# Patient Record
Sex: Male | Born: 1970 | Race: White | Hispanic: No | Marital: Married | Smoking: Former smoker
Health system: Southern US, Community
[De-identification: ages and names within clinical notes are randomized; demographics above are authoritative.]

## PROBLEM LIST (undated history)

## (undated) DIAGNOSIS — F32A Depression, unspecified: Secondary | ICD-10-CM

## (undated) DIAGNOSIS — F329 Major depressive disorder, single episode, unspecified: Secondary | ICD-10-CM

## (undated) DIAGNOSIS — F419 Anxiety disorder, unspecified: Secondary | ICD-10-CM

## (undated) HISTORY — DX: Depression, unspecified: F32.A

## (undated) HISTORY — PX: VASECTOMY: SHX75

## (undated) HISTORY — DX: Major depressive disorder, single episode, unspecified: F32.9

## (undated) HISTORY — DX: Anxiety disorder, unspecified: F41.9

---

## 2005-03-15 ENCOUNTER — Inpatient Hospital Stay (HOSPITAL_COMMUNITY): Admission: EM | Admit: 2005-03-15 | Discharge: 2005-03-16 | Payer: Self-pay | Admitting: Emergency Medicine

## 2005-03-15 ENCOUNTER — Ambulatory Visit: Payer: Self-pay | Admitting: Family Medicine

## 2005-03-26 ENCOUNTER — Encounter: Admission: RE | Admit: 2005-03-26 | Discharge: 2005-03-26 | Payer: Self-pay | Admitting: Neurology

## 2010-08-13 ENCOUNTER — Encounter
Admission: RE | Admit: 2010-08-13 | Discharge: 2010-08-13 | Payer: Self-pay | Source: Home / Self Care | Attending: Family Medicine | Admitting: Family Medicine

## 2010-12-26 NOTE — H&P (Signed)
NAMEJOSEANDRES, MAZER             ACCOUNT NO.:  [id number]   MEDICAL RECORD NO.:  [id number]          PATIENT TYPE:  INP   LOCATION:  1831                         FACILITY:  [facility]   PHYSICIAN:  Wayne A. Sheffield Slider, M.D.    DATE OF BIRTH:  [birth year]   DATE OF ADMISSION:  03/15/2005  DATE OF DISCHARGE:                                HISTORY & PHYSICAL   PRIMARY CARE PHYSICIAN:  Brett Canales A. Daub, M.D.   CHIEF COMPLAINT:  Headache and meningism, subjective.   HISTORY OF PRESENT ILLNESS:  The patient is a 40 year old Caucasian male old Caucasian male  with no significant past medical history who presents with three days of  headaches, low grade fevers, myalgias, arthralgias, and meningism. The  patient states that he began with headaches on Friday night. The headaches  were frontal in location, throbbing in nature, also accompanied with  photophobia and phonophobia. The patient also had nausea with the headaches.  By Saturday, he was starting to have fevers and neck stiffness. The patient  states he is unable to touch his chin to either his left or right shoulder.   Beginning Saturday and heading into Sunday morning, the patient also was  having fevers, low grade in nature, and also some nausea and no vomiting,  along with the meningismus. The headaches persisted. The patient's headaches  were refractory to Midrin x4 today. The patient was seen at [facility] and referred to the emergency department for evaluation for possible  meningitis versus RMSF.   PAST MEDICAL HISTORY:  Depression.   MEDICATIONS:  1.  Lexapro 20 mg q.a.m.  2.  Multivitamin 1 p.o. daily.   ALLERGIES:  No known drug allergies.   PAST SURGICAL HISTORY:  Negative.   FAMILY HISTORY:  Mother is living with hypertension, otherwise alive and  well. Dad is living with hypertension, otherwise alive and well. He has a  couple of siblings that are also alive and well.   SOCIAL HISTORY:  The patient quit smoking approximately 18  years ago. Drinks  approximately three beers or glasses of wine per week. No recreational  drugs. Lives with wife and three children. 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   PHYSICAL EXAMINATION:  VITAL SIGNS:  Temperature at [facility] is  99.8, pulse is 77, respiratory 17, blood pressure is 122/72.  GENERAL:  In no apparent distress. Alert and oriented x3. Pleasant Caucasian  male.  HEENT:  Normocephalic and atraumatic. Pupils are equal, round, and reactive  to light. Extraocular movements intact. No thyromegaly, no JVD, no carotid  bruits auscultated.  CARDIOVASCULAR:  Regular rate and rhythm. No murmurs, gallops, or rubs.  LUNGS:  Clear to auscultation bilaterally. No wheezes, crackles, or rales.  ABDOMEN:  Soft, nontender, nondistended. Positive bowel sounds.  EXTREMITIES:  No clubbing, cyanosis, or edema. No petechiae, purpura  exhibited on the bilateral shins, hands, or palms. There is possibly an old  bug bite, it is approximately a 3 mm erythematous papule with a black  pinpoint center on the right shin.  NEUROLOGICAL:  Cranial nerves II through XII are grossly intact.  There is  5/5 equal and symmetric muscle strength bilaterally with normal reflexes.   LABORATORY DATA:  Laboratory values from [facility] include a white  count of 4.6, hemoglobin of 15.7, hematocrit of 44, platelet count of  299,000.   ASSESSMENT:  A 40 year old Caucasian male with no specific past medical  history with symptoms concerning for possible meningismus versus [facility] Spotted Fever.   PLAN:  1.  Headache:  The headaches seem almost like migraines but the patient has      never had a history of migraines this severe before. We will go ahead      and treat the patient empirically for RMSF with doxycycline 100 mg p.o.      b.i.d. RMSF titers were sent by [facility]. Also, we will      perform a lumbar puncture on the patient. We will send CSF in tube one       for a cell count and differential; CSF in tube two for protein and      glucose; CSF in tube three for Gram-stain and culture; hold tube four      for further studies. We will then treat the patient empirically with 1      gm of Rocephin IV x1 right now to cover for possible bacterial      meningitis until the Gram-stain rules out otherwise.  2.  FEN:  We will also check a CMP to evaluate for possible signs and      symptoms of RMSF including hyponatremia, elevated liver function tests.      We will check a CBC with a differential to also evaluate for low      platelets and possibly anemia.  3.  RMSF:  Doubt that we will find evidence of this and we will treat the      patient empirically regardless.  4.  Headache/neck pain:  We will give the patient Percocet as needed for      headaches and also morphine for breakthrough for his neck pain. Could      consider a muscle relaxer at discharge such as Flexeril or Skelaxin. We      will follow up the CSF studies tonight. The lumbar puncture was      performed in a sterile fashion without any difficult producing four      tubes of approximately 3 or 4 cc each tube of clear, nonbloody fluid.      Priscille Heidelberg   WTP/MEDQ  D:  03/15/2005  T:  03/16/2005  Job:  [id number]   cc:   Brett Canales A. Cleta Alberts, M.D.  [address]  [place]  [place] [address]  Fax: [phone]

## 2010-12-26 NOTE — Discharge Summary (Signed)
Joshua Hardin, Joshua Hardin             ACCOUNT NO.:  [id number]   MEDICAL RECORD NO.:  [id number]          PATIENT TYPE:  INP   LOCATION:  3037                         FACILITY:  [facility]   PHYSICIAN:  Broadus John T. Pickard II, MDDATE OF BIRTH:  1970/11/19   DATE OF ADMISSION:  03/15/2005  DATE OF DISCHARGE:  03/16/2005                                 DISCHARGE SUMMARY   PRIMARY CARE PHYSICIAN:  Brett Canales A. Cleta Alberts, M.D. at [facility]   ADMISSION DIAGNOSES:  1. Meningismus.  2. Headache.  3. Myalgias and arthralgias.  4. Depression.   DISCHARGE DIAGNOSES:  1. Probable aseptic meningitis versus [facility] Spotted Fever.  2. Depression.   PROCEDURES:  Lumbar puncture performed on March 15, 2005 revealed no  pleocytosis.  White blood cells were 2 and red blood cells 3.  Other cells  were too few to count.  Glucose 67 and protein 31.   DISCHARGE INSTRUCTIONS:  No dietary restrictions.  Activity ad lib.   DISCHARGE MEDICATIONS:  1. Doxycycline 100 mg p.o. b.i.d. x7 days.  2. Lexapro 20 mg p.o. every morning.  3. Daily multivitamin p.o.  4. Percocet 5/325 1-2 tabs p.o. q.4h. as needed for headache, dispense      #30.  5. Phenergan 25 mg p.o. every four hours as needed for nausea, dispense      #30.   PERTINENT LABORATORY DATA:  The patient was found to have a white count of  4.6, hemoglobin 5.7, hematocrit 44, and platelet count of 299,000 at [facility].  [place] Spotted Fever titers were sent and are  currently pending.  At [facility] the patient was found to have a  normal BMP with a sodium of 135.  White count was found to be 6.4,  hemoglobin 14.1, hematocrit 40.0, and platelets 329,000.  LFT's were mildly  elevated with an AST of 46, and an ALT of 50.   HISTORY OF PRESENT ILLNESS:  This is a 40 year old Caucasian male with no  significant past medical history who presented to the emergency department  with a history of headaches, subjective fever and  chills, body aches, and  meningismus since Friday evening.  The patient began with a headache on  Friday night.  The headache was still present upon wakening Saturday  morning, and was then associated with nausea, body aches and subjective  fever and chills.  The headache is frontal in location, throbbing in nature,  and associated with nausea, photophobia, and phonophobia.  The headache is  also worsened by activity.  On Sunday morning, the patient continued to have  the same symptoms, and took four Midrin for the headache.  The patient  received no relief with Midrin, thus he presented to [facility]  where he was referred by Dr. Cleta Alberts to the emergency department for evaluation  of possible meningitis versus RMSF.   HOSPITAL COURSE:  Problem 1. Headache and meningismus.  A lumbar puncture  was obtained in the emergency department.  The patient was given single dose  of one gram of Rocephin IV while CSF cytology was pending.  The patient was  also started on empiric treatment for RMSF with doxycycline 100 mg b.i.d.  The patient is to complete a seven day course of the doxycycline upon  discharge.  The CSF studies were negative for pleocytosis or signs of viral  or bacterial meningitis.  Thus, the patient was felt to most likely have a  septic meningitis versus RMSF.  RMSF titers are pending through Dr. Ellis Parents  office at the time of discharge.  The patient's mildly elevated liver  enzymes could be consistent with an RMSF picture, however, the patient does  state he has had mild elevated liver enzymes in the past with a negative  hepatitis workup.  The patient will followup one week from discharge after  completion of a seven day course of doxycycline with Dr. Cleta Alberts at [facility].  If the headaches have not resolved at this time, further testing  with possibly a head CT might be indicated.  The patient was also discharged  with Percocet and Phenergan as needed for symptomatic  relief over the next  week.  Problem 2. Depression.  The patient's depression remained stable on his home  dose of Lexapro 20 mg every morning.   DISPOSITION:  The patient was discharged in stable condition to home with a  prescription to complete a seven day course of doxycycline 100 mg p.o.  b.i.d.  The Rocephin was not continued given the patient's CSF studies were  negative for bacterial meningitis.  The patient has a followup appointment  on March 24, 2005 at 8:30 a.m. with Dr. Cleta Alberts of [facility].       EE/MEDQ  D:  03/16/2005  T:  03/17/2005  Job:  [id number]   cc:   Brett Canales A. Cleta Alberts, M.D.  [address]  [place]  [place] [address]  Fax: [phone]

## 2012-08-22 ENCOUNTER — Ambulatory Visit (INDEPENDENT_AMBULATORY_CARE_PROVIDER_SITE_OTHER): Payer: BC Managed Care – PPO | Admitting: Physician Assistant

## 2012-08-22 VITALS — BP 110/66 | HR 88 | Temp 99.6°F | Resp 18 | Ht 70.0 in | Wt 190.0 lb

## 2012-08-22 DIAGNOSIS — R05 Cough: Secondary | ICD-10-CM

## 2012-08-22 DIAGNOSIS — R509 Fever, unspecified: Secondary | ICD-10-CM

## 2012-08-22 DIAGNOSIS — R059 Cough, unspecified: Secondary | ICD-10-CM

## 2012-08-22 DIAGNOSIS — J029 Acute pharyngitis, unspecified: Secondary | ICD-10-CM

## 2012-08-22 LAB — POCT CBC
Granulocyte percent: 84.7 %G — AB (ref 37–80)
HCT, POC: 45.3 % (ref 43.5–53.7)
Hemoglobin: 14.4 g/dL (ref 14.1–18.1)
Lymph, poc: 1.1 (ref 0.6–3.4)
MCH, POC: 30.4 pg (ref 27–31.2)
MCHC: 31.8 g/dL (ref 31.8–35.4)
MCV: 95.6 fL (ref 80–97)
MID (cbc): 0.5 (ref 0–0.9)
MPV: 7.8 fL (ref 0–99.8)
POC Granulocyte: 8.7 — AB (ref 2–6.9)
POC LYMPH PERCENT: 10.8 %L (ref 10–50)
POC MID %: 4.5 %M (ref 0–12)
Platelet Count, POC: 297 10*3/uL (ref 142–424)
RBC: 4.74 M/uL (ref 4.69–6.13)
RDW, POC: 13.1 %
WBC: 10.3 10*3/uL — AB (ref 4.6–10.2)

## 2012-08-22 LAB — POCT INFLUENZA A/B
Influenza A, POC: NEGATIVE
Influenza B, POC: NEGATIVE

## 2012-08-22 LAB — POCT RAPID STREP A (OFFICE): Rapid Strep A Screen: NEGATIVE

## 2012-08-22 MED ORDER — HYDROCOD POLST-CHLORPHEN POLST 10-8 MG/5ML PO LQCR: 5.0000 mL | Freq: Two times a day (BID) | ORAL | Status: AC | PRN

## 2012-08-22 MED ORDER — OSELTAMIVIR PHOSPHATE 75 MG PO CAPS: 75.0000 mg | ORAL_CAPSULE | Freq: Two times a day (BID) | ORAL | Status: AC

## 2012-08-22 NOTE — Progress Notes (Signed)
Patient ID: BETTY BROOKS MRN: [id number], DOB: July 16, 1971, 42 y.o. Date of Encounter: 08/22/2012, 2:39 PM  Primary Physician: Thora Lance, MD  Chief Complaint: fever, chills, nasal congestion  HPI: 42 y.o. year old male with presents with 1 day history of nasal congestion, fever, chills, and slight dry cough. Denies chest pain, SOB, nausea, vomiting, or dizziness. Has not taken anything OTC yet.  Did take the flu shot this year - received it about 2 weeks ago.    Patient is otherwise doing well without issues or complaints.  Past Medical History  Diagnosis Date  . Depression   . Anxiety      Home Meds: Prior to Admission medications   Medication Sig Start Date End Date Taking? Authorizing Provider  escitalopram (LEXAPRO) 20 MG tablet Take 20 mg by mouth daily.   Yes Historical Provider, MD  chlorpheniramine-HYDROcodone (TUSSIONEX PENNKINETIC ER) 10-8 MG/5ML LQCR Take 5 mLs by mouth every 12 (twelve) hours as needed (cough). 08/22/12   Maleny Candy Jaquita Rector, PA-C  oseltamivir (TAMIFLU) 75 MG capsule Take 1 capsule (75 mg total) by mouth 2 (two) times daily. 08/22/12   Nelva Nay, PA-C    Allergies: No Known Allergies  History   Social History  . Marital Status: Married    Spouse Name: N/A    Number of Children: N/A  . Years of Education: N/A   Occupational History  . Not on file.   Social History Main Topics  . Smoking status: Former Games developer  . Smokeless tobacco: Not on file  . Alcohol Use: Yes  . Drug Use: No  . Sexually Active: Yes    Birth Control/ Protection: Condom   Other Topics Concern  . Not on file   Social History Narrative  . No narrative on file     Review of Systems: Constitutional: positive for chills and fever. Negative for night sweats, weight changes, or fatigue  HEENT: negative for vision changes, hearing loss. Positive for congestion, rhinorrhea, ST. No epistaxis, or sinus pressure Cardiovascular: negative for chest pain or  palpitations Respiratory: positive for cough. negative for hemoptysis, wheezing, shortness of breath. Abdominal: negative for abdominal pain, nausea, vomiting, diarrhea, or constipation Dermatological: negative for rashes. Neurologic: negative for headache, dizziness, or syncope   Physical Exam: Blood pressure 110/66, pulse 88, temperature 99.6 F (37.6 C), temperature source Oral, resp. rate 18, height 5\' 10"  (1.778 m), weight 190 lb (86.183 kg)., Body mass index is 27.26 kg/(m^2). General: Well developed, well nourished, in no acute distress. Head: Normocephalic, atraumatic, eyes without discharge, sclera non-icteric, nares are without discharge. Bilateral auditory canals clear, TM's are without perforation, pearly grey and translucent with reflective cone of light bilaterally. Oral cavity moist, posterior pharynx without exudate, erythema, peritonsillar abscess, or post nasal drip.  Neck: Supple. No thyromegaly. Full ROM. No lymphadenopathy. Lungs: Clear bilaterally to auscultation without wheezes, rales, or rhonchi. Breathing is unlabored. Heart: RRR with S1 S2. No murmurs, rubs, or gallops appreciated. Msk:  Strength and tone normal for age. Extremities/Skin: Warm and dry. No clubbing or cyanosis. No edema.  Neuro: Alert and oriented X 3. Moves all extremities spontaneously. Gait is normal. CNII-XII grossly in tact. Psych:  Responds to questions appropriately with a normal affect.   Labs: Results for orders placed in visit on 08/22/12  POCT INFLUENZA A/B      Component Value Range   Influenza A, POC Negative     Influenza B, POC Negative    POCT RAPID STREP A (OFFICE)  Component Value Range   Rapid Strep A Screen Negative  Negative  POCT CBC      Component Value Range   WBC 10.3 (*) 4.6 - 10.2 K/uL   Lymph, poc 1.1  0.6 - 3.4   POC LYMPH PERCENT 10.8  10 - 50 %L   MID (cbc) 0.5  0 - 0.9   POC MID % 4.5  0 - 12 %M   POC Granulocyte 8.7 (*) 2 - 6.9   Granulocyte percent  84.7 (*) 37 - 80 %G   RBC 4.74  4.69 - 6.13 M/uL   Hemoglobin 14.4  14.1 - 18.1 g/dL   HCT, POC 09.8  11.9 - 53.7 %   MCV 95.6  80 - 97 fL   MCH, POC 30.4  27 - 31.2 pg   MCHC 31.8  31.8 - 35.4 g/dL   RDW, POC 14.7     Platelet Count, POC 297  142 - 424 K/uL   MPV 7.8  0 - 99.8 fL     ASSESSMENT AND PLAN:  42 y.o. year old male with influenza-like illness.  Increase fluids and rest Ibuprofen and tylenol in alternating doses q3hours Will cover with Tamiflu 75 mg bid x 5 days Tussionex q12hours prn cough Follow up if symptoms worsen or fail to improve.  Grier Mitts, PA-C 08/22/2012 2:39 PM

## 2013-10-18 ENCOUNTER — Other Ambulatory Visit: Payer: Self-pay | Admitting: Family Medicine

## 2013-10-18 DIAGNOSIS — R51 Headache: Secondary | ICD-10-CM

## 2013-10-20 ENCOUNTER — Ambulatory Visit
Admission: RE | Admit: 2013-10-20 | Discharge: 2013-10-20 | Disposition: A | Discharge status: Home / Self Care | Payer: BC Managed Care – PPO | Source: Ambulatory Visit | Attending: Family Medicine | Admitting: Family Medicine

## 2013-10-20 DIAGNOSIS — R51 Headache: Secondary | ICD-10-CM

## 2014-05-17 ENCOUNTER — Ambulatory Visit (INDEPENDENT_AMBULATORY_CARE_PROVIDER_SITE_OTHER): Payer: BC Managed Care – PPO

## 2014-05-17 ENCOUNTER — Ambulatory Visit (INDEPENDENT_AMBULATORY_CARE_PROVIDER_SITE_OTHER): Payer: BC Managed Care – PPO | Admitting: Family Medicine

## 2014-05-17 VITALS — BP 124/68 | HR 64 | Temp 98.2°F | Resp 16 | Ht 70.0 in | Wt 180.6 lb

## 2014-05-17 DIAGNOSIS — M25532 Pain in left wrist: Secondary | ICD-10-CM

## 2014-05-17 DIAGNOSIS — M778 Other enthesopathies, not elsewhere classified: Secondary | ICD-10-CM

## 2014-05-17 DIAGNOSIS — M25432 Effusion, left wrist: Secondary | ICD-10-CM

## 2014-05-17 MED ORDER — MELOXICAM 7.5 MG PO TABS: 7.5000 mg | ORAL_TABLET | Freq: Every day | ORAL | Status: AC

## 2014-05-17 NOTE — Progress Notes (Signed)
Subjective:  This chart was scribed for Shade FloodJeffrey R Mohogany Toppins, MD by Charline BillsEssence Howell, ED Scribe. The patient was seen in room 1. Patient's care was started at 10:50 AM.   Patient ID: Joshua Hardin, male    DOB: 12/07/1970, 43 y.o.   MRN: [id number]  Chief Complaint  Patient presents with  . Wrist Pain    left x 1.5 weeks    HPI Joshua Hardin is a 43 y.o. male who presents with constant L wrist pain that radiates to L shoulder over the past 2 weeks. Pain is exacerbated with movement. He denies numbness. Pt reports similar pain 1 year ago, no evaluation; pt self treated with wrist brace and OTC Aleve; pain improved after a few weeks. 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 Pt does Insanity workouts but has not exercised over the past 2 weeks; he still reports wrist pain with taking time off. He denies change in activity or injury. Pt is R hand dominant. Pt also reports intermittent neck soreness that he attributes to driving; does not radiate to wrist; unrelated to wrist pain. He takes 1-2 Aleve tablets with temporary relief.  There are no active problems to display for this patient.  Past Medical History  Diagnosis Date  . Depression   . Anxiety    Past Surgical History  Procedure Laterality Date  . Vasectomy     No Known Allergies Prior to Admission medications   Medication Sig Start Date End Date Taking? Authorizing Provider  escitalopram (LEXAPRO) 20 MG tablet Take 20 mg by mouth daily.   Yes Historical Provider, MD  chlorpheniramine-HYDROcodone (TUSSIONEX PENNKINETIC ER) 10-8 MG/5ML LQCR Take 5 mLs by mouth every 12 (twelve) hours as needed (cough). 08/22/12   Heather Jaquita RectorM Marte, PA-C  oseltamivir (TAMIFLU) 75 MG capsule Take 1 capsule (75 mg total) by mouth 2 (two) times daily. 08/22/12   Nelva NayHeather M Marte, PA-C   History   Social History  . Marital Status: Married    Spouse Name: N/A    Number of Children: N/A  . Years of  Education: N/A   Occupational History  . Not on file.   Social History Main Topics  . Smoking status: Current Every Day Smoker -- 0.50 packs/day    Types: Cigarettes  . Smokeless tobacco: Not on file  . Alcohol Use: Yes  . Drug Use: No  . Sexual Activity: Yes    Birth Control/ Protection: Condom   Other Topics Concern  . Not on file   Social History Narrative  . No narrative on file   Review of Systems  Constitutional: Negative for fatigue and unexpected weight change.  Eyes: Negative for visual disturbance.  Respiratory: Negative for cough, chest tightness and shortness of breath.   Cardiovascular: Negative for chest pain, palpitations and leg swelling.  Gastrointestinal: Negative for abdominal pain and blood in stool.  Musculoskeletal: Positive for arthralgias.  Neurological: Negative for dizziness, light-headedness, numbness and headaches.      Objective:   Physical Exam  Vitals reviewed. Constitutional: He is oriented to person, place, and time. He appears well-developed and well-nourished.  HENT:  Head: Normocephalic and atraumatic.  Eyes: EOM are normal. Pupils are equal, round, and reactive to light.  Neck: No JVD present. Carotid bruit is not present.  Cardiovascular: Normal rate, regular rhythm and normal heart sounds.   No murmur heard. Pulmonary/Chest: Effort normal and breath sounds normal. He  has no rales.  Musculoskeletal: He exhibits no edema.  L elbow: Full ROM No bony tenderness Negative Tinels over ulnar and radial  C-spine: Full c-spine ROM Did not reproduce symptoms  Negative Tinels over brachial plexus L shoulder: Full ROM Full rotator cuff strength  L wrist: Tenderness over pronator teres  NVI distally Slight decreased flexion and extension Equal ulnar and radial deviation  Normal pronation and supination  Describes most pain at dorsal wrist with some bony prominence over the area of the lunate  Slight tenderness over the scaphoid but  more at base of 1st CMC Slight tenderness over distal MCs of 2nd and 3rd phalnax  Full strength in hand including with finger apposition and normal "ok" sign Negative Tinels and negative Phalen  Negative finkelstein   Neurological: He is alert and oriented to person, place, and time.  Skin: Skin is warm and dry.  Psychiatric: He has a normal mood and affect.   Filed Vitals:   05/17/14 1005  BP: 124/68  Pulse: 64  Temp: 98.2 F (36.8 C)  TempSrc: Oral  Resp: 16  Height: 5\' 10"  (1.778 m)  Weight: 180 lb 9.6 oz (81.92 kg)  SpO2: 99%   [facility] reading (PRIMARY) by Dr. Neva Seat of L wrist:  No apparent fracture. No apparent widening of scapholunate area with clenched fist.     Assessment & Plan:   [name] is a 43 y.o. male Left wrist pain - Plan: DG Wrist Complete Left, meloxicam (MOBIC) 7.5 MG tablet  Wrist swelling, left - Plan: meloxicam (MOBIC) 7.5 MG tablet  Tendonitis of wrist, left - Plan: meloxicam (MOBIC) 7.5 MG tablet  Suspected tenosynovitis with overuse or exercise program. Decrease use, thumb spica brace, and mobic for next 10 days then recheck if persistent. rtc sooner if worse.   Meds ordered this encounter  Medications  . meloxicam (MOBIC) 7.5 MG tablet    Sig: Take 1-2 tablets (7.5-15 mg total) by mouth daily.    Dispense:  30 tablet    Refill:  0   Patient Instructions  Your pain may be overuse or some tendonitis of the wrist, which may also be cause of pain moving up arm. We can try a different brace, meloxicam once or twice per day (do not combine with other nsaids like alleve) and continue to avoid use of this wrist with exercises. Ok to come out of wrist once or twice per day. Recheck in next 10 days. Sooner if worse.   Wrist Pain Wrist injuries are frequent in adults and children. A sprain is an injury to the ligaments that hold your bones together. A strain is an injury to muscle or muscle cord-like structures (tendons) from stretching or pulling.  Generally, when wrists are moderately tender to touch following a fall or injury, a break in the bone (fracture) may be present. Most wrist sprains or strains are better in 3 to 5 days, but complete healing may take several weeks. HOME CARE INSTRUCTIONS   Put ice on the injured area.  Put ice in a plastic bag.  Place a towel between your skin and the bag.  Leave the ice on for 15-20 minutes, 3-4 times a day, for the first 2 days, or as directed by your health care provider.  Keep your arm raised above the level of your heart whenever possible to reduce swelling and pain.  Rest the injured area for at least 48 hours or as directed by your health care provider.  If  a splint or elastic bandage has been applied, use it for as long as directed by your health care provider or until seen by a health care provider for a follow-up exam.  Only take over-the-counter or prescription medicines for pain, discomfort, or fever as directed by your health care provider.  Keep all follow-up appointments. You may need to follow up with a specialist or have follow-up X-rays. Improvement in pain level is not a guarantee that you did not fracture a bone in your wrist. The only way to determine whether or not you have a broken bone is by X-ray. SEEK IMMEDIATE MEDICAL CARE IF:   Your fingers are swollen, very red, white, or cold and blue.  Your fingers are numb or tingling.  You have increasing pain.  You have difficulty moving your fingers. MAKE SURE YOU:   Understand these instructions.  Will watch your condition.  Will get help right away if you are not doing well or get worse. Document Released: 05/06/2005 Document Revised: 08/01/2013 Document Reviewed: 09/17/2010 [facility] Patient Information 2015 [place], [place]. This information is not intended to replace advice given to you by your health care provider. Make sure you discuss any questions you have with your health care provider.     I  personally performed the services described in this documentation, which was scribed in my presence. The recorded information has been reviewed and considered, and addended by me as needed.

## 2014-05-17 NOTE — Patient Instructions (Signed)
Your pain may be overuse or some tendonitis of the wrist, which may also be cause of pain moving up arm. We can try a different brace, meloxicam once or twice per day (do not combine with other nsaids like alleve) and continue to avoid use of this wrist with exercises. Ok to come out of wrist once or twice per day. Recheck in next 10 days. Sooner if worse.   Wrist Pain Wrist injuries are frequent in adults and children. A sprain is an injury to the ligaments that hold your bones together. A strain is an injury to muscle or muscle cord-like structures (tendons) from stretching or pulling. Generally, when wrists are moderately tender to touch following a fall or injury, a break in the bone (fracture) may be present. Most wrist sprains or strains are better in 3 to 5 days, but complete healing may take several weeks. HOME CARE INSTRUCTIONS   Put ice on the injured area.  Put ice in a plastic bag.  Place a towel between your skin and the bag.  Leave the ice on for 15-20 minutes, 3-4 times a day, for the first 2 days, or as directed by your health care provider.  Keep your arm raised above the level of your heart whenever possible to reduce swelling and pain.  Rest the injured area for at least 48 hours or as directed by your health care provider.  If a splint or elastic bandage has been applied, use it for as long as directed by your health care provider or until seen by a health care provider for a follow-up exam.  Only take over-the-counter or prescription medicines for pain, discomfort, or fever as directed by your health care provider.  Keep all follow-up appointments. You may need to follow up with a specialist or have follow-up X-rays. Improvement in pain level is not a guarantee that you did not fracture a bone in your wrist. The only way to determine whether or not you have a broken bone is by X-ray. SEEK IMMEDIATE MEDICAL CARE IF:   Your fingers are swollen, very red, white, or cold and  blue.  Your fingers are numb or tingling.  You have increasing pain.  You have difficulty moving your fingers. MAKE SURE YOU:   Understand these instructions.  Will watch your condition.  Will get help right away if you are not doing well or get worse. Document Released: 05/06/2005 Document Revised: 08/01/2013 Document Reviewed: 09/17/2010 [facility] Patient Information 2015 [employer], [place]. This information is not intended to replace advice given to you by your health care provider. Make sure you discuss any questions you have with your health care provider.

## 2015-08-02 ENCOUNTER — Ambulatory Visit (HOSPITAL_COMMUNITY)
Admission: EM | Admit: 2015-08-02 | Discharge: 2015-08-05 | Disposition: A | Discharge status: Home / Self Care | Payer: BLUE CROSS/BLUE SHIELD | Attending: Neurological Surgery | Admitting: Neurological Surgery

## 2015-08-02 ENCOUNTER — Encounter (HOSPITAL_COMMUNITY): Payer: Self-pay

## 2015-08-02 ENCOUNTER — Emergency Department (HOSPITAL_COMMUNITY): Payer: BLUE CROSS/BLUE SHIELD

## 2015-08-02 ENCOUNTER — Ambulatory Visit (HOSPITAL_COMMUNITY): Payer: BLUE CROSS/BLUE SHIELD

## 2015-08-02 DIAGNOSIS — R531 Weakness: Secondary | ICD-10-CM | POA: Insufficient documentation

## 2015-08-02 DIAGNOSIS — W228XXA Striking against or struck by other objects, initial encounter: Secondary | ICD-10-CM | POA: Insufficient documentation

## 2015-08-02 DIAGNOSIS — R262 Difficulty in walking, not elsewhere classified: Secondary | ICD-10-CM | POA: Insufficient documentation

## 2015-08-02 DIAGNOSIS — M79601 Pain in right arm: Secondary | ICD-10-CM | POA: Insufficient documentation

## 2015-08-02 DIAGNOSIS — Z87891 Personal history of nicotine dependence: Secondary | ICD-10-CM | POA: Diagnosis not present

## 2015-08-02 DIAGNOSIS — Z23 Encounter for immunization: Secondary | ICD-10-CM | POA: Diagnosis not present

## 2015-08-02 DIAGNOSIS — R339 Retention of urine, unspecified: Secondary | ICD-10-CM | POA: Diagnosis not present

## 2015-08-02 DIAGNOSIS — S129XXA Fracture of neck, unspecified, initial encounter: Secondary | ICD-10-CM | POA: Diagnosis present

## 2015-08-02 DIAGNOSIS — S12500A Unspecified displaced fracture of sixth cervical vertebra, initial encounter for closed fracture: Secondary | ICD-10-CM | POA: Insufficient documentation

## 2015-08-02 DIAGNOSIS — T148XXA Other injury of unspecified body region, initial encounter: Secondary | ICD-10-CM

## 2015-08-02 LAB — CBC WITH DIFFERENTIAL/PLATELET
BASOS ABS: 0 10*3/uL (ref 0.0–0.1)
BASOS PCT: 0 %
EOS PCT: 0 %
Eosinophils Absolute: 0 10*3/uL (ref 0.0–0.7)
HCT: 42.1 % (ref 39.0–52.0)
Hemoglobin: 14.8 g/dL (ref 13.0–17.0)
Lymphocytes Relative: 9 %
Lymphs Abs: 1 10*3/uL (ref 0.7–4.0)
MCH: 31.8 pg (ref 26.0–34.0)
MCHC: 35.2 g/dL (ref 30.0–36.0)
MCV: 90.5 fL (ref 78.0–100.0)
MONO ABS: 0.9 10*3/uL (ref 0.1–1.0)
Monocytes Relative: 8 %
Neutro Abs: 9.3 10*3/uL — ABNORMAL HIGH (ref 1.7–7.7)
Neutrophils Relative %: 83 %
PLATELETS: 223 10*3/uL (ref 150–400)
RBC: 4.65 MIL/uL (ref 4.22–5.81)
RDW: 12.5 % (ref 11.5–15.5)
WBC: 11.3 10*3/uL — ABNORMAL HIGH (ref 4.0–10.5)

## 2015-08-02 LAB — COMPREHENSIVE METABOLIC PANEL
ALBUMIN: 4.3 g/dL (ref 3.5–5.0)
ALT: 42 U/L (ref 17–63)
ANION GAP: 13 (ref 5–15)
AST: 52 U/L — AB (ref 15–41)
Alkaline Phosphatase: 45 U/L (ref 38–126)
BUN: 13 mg/dL (ref 6–20)
CHLORIDE: 103 mmol/L (ref 101–111)
CO2: 22 mmol/L (ref 22–32)
Calcium: 9.2 mg/dL (ref 8.9–10.3)
Creatinine, Ser: 0.97 mg/dL (ref 0.61–1.24)
GFR calc Af Amer: >60 mL/min (ref >60)
Glucose, Bld: 81 mg/dL (ref 65–99)
POTASSIUM: 3.7 mmol/L (ref 3.5–5.1)
Sodium: 138 mmol/L (ref 135–145)
Total Bilirubin: 0.9 mg/dL (ref 0.3–1.2)
Total Protein: 6.9 g/dL (ref 6.5–8.1)

## 2015-08-02 LAB — TYPE AND SCREEN
ABO/RH(D): O POS
ANTIBODY SCREEN: NEGATIVE

## 2015-08-02 LAB — APTT: APTT: 31 s (ref 24–37)

## 2015-08-02 LAB — ABO/RH: ABO/RH(D): O POS

## 2015-08-02 LAB — PROTIME-INR
INR: 1.22 (ref 0.00–1.49)
PROTHROMBIN TIME: 15.5 s — AB (ref 11.6–15.2)

## 2015-08-02 MED ORDER — MENTHOL 3 MG MT LOZG: 1.0000 | LOZENGE | OROMUCOSAL | Status: DC | PRN

## 2015-08-02 MED ORDER — SENNA 8.6 MG PO TABS
1.0000 | ORAL_TABLET | Freq: Two times a day (BID) | ORAL | Status: DC
  Administered 2015-08-02 – 2015-08-05 (×6): 8.6 mg via ORAL
  Filled 2015-08-02 (×6): qty 1

## 2015-08-02 MED ORDER — POTASSIUM CHLORIDE IN NACL 20-0.9 MEQ/L-% IV SOLN
INTRAVENOUS | Status: DC
  Administered 2015-08-02: 18:00:00 via INTRAVENOUS
  Filled 2015-08-02 (×6): qty 1000

## 2015-08-02 MED ORDER — ACETAMINOPHEN 325 MG PO TABS: 650.0000 mg | ORAL_TABLET | ORAL | Status: DC | PRN

## 2015-08-02 MED ORDER — CYCLOBENZAPRINE HCL 10 MG PO TABS
10.0000 mg | ORAL_TABLET | Freq: Three times a day (TID) | ORAL | Status: DC | PRN
  Administered 2015-08-02 – 2015-08-05 (×4): 10 mg via ORAL
  Filled 2015-08-02 (×4): qty 1

## 2015-08-02 MED ORDER — MORPHINE SULFATE (PF) 4 MG/ML IV SOLN
4.0000 mg | Freq: Once | INTRAVENOUS | Status: AC
  Administered 2015-08-02: 4 mg via INTRAVENOUS
  Filled 2015-08-02: qty 1

## 2015-08-02 MED ORDER — MORPHINE SULFATE (PF) 4 MG/ML IV SOLN: 4.0000 mg | INTRAVENOUS | Status: DC | PRN

## 2015-08-02 MED ORDER — PHENOL 1.4 % MT LIQD: 1.0000 | OROMUCOSAL | Status: DC | PRN

## 2015-08-02 MED ORDER — LAMOTRIGINE 100 MG PO TABS
150.0000 mg | ORAL_TABLET | Freq: Every day | ORAL | Status: DC
  Administered 2015-08-03 – 2015-08-05 (×3): 150 mg via ORAL
  Filled 2015-08-02 (×3): qty 2

## 2015-08-02 MED ORDER — ONDANSETRON HCL 4 MG/2ML IJ SOLN: 4.0000 mg | INTRAMUSCULAR | Status: DC | PRN

## 2015-08-02 MED ORDER — MORPHINE SULFATE (PF) 4 MG/ML IV SOLN
4.0000 mg | Freq: Once | INTRAVENOUS | Status: AC
Start: 2015-08-02 — End: 2015-08-02
  Administered 2015-08-02: 4 mg via INTRAVENOUS
  Filled 2015-08-02: qty 1

## 2015-08-02 MED ORDER — DIAZEPAM 5 MG/ML IJ SOLN
5.0000 mg | Freq: Once | INTRAMUSCULAR | Status: AC
  Administered 2015-08-02: 5 mg via INTRAVENOUS
  Filled 2015-08-02: qty 2

## 2015-08-02 MED ORDER — SODIUM CHLORIDE 0.9 % IV SOLN: 250.0000 mL | INTRAVENOUS | Status: DC

## 2015-08-02 MED ORDER — ACETAMINOPHEN 650 MG RE SUPP
650.0000 mg | RECTAL | Status: DC | PRN
Start: 2015-08-02 — End: 2015-08-05

## 2015-08-02 MED ORDER — MORPHINE SULFATE (PF) 2 MG/ML IV SOLN
1.0000 mg | INTRAVENOUS | Status: DC | PRN
  Administered 2015-08-02 – 2015-08-04 (×7): 2 mg via INTRAVENOUS
  Filled 2015-08-02 (×7): qty 1

## 2015-08-02 MED ORDER — KETOROLAC TROMETHAMINE 30 MG/ML IJ SOLN
30.0000 mg | Freq: Once | INTRAMUSCULAR | Status: AC
  Administered 2015-08-02: 30 mg via INTRAVENOUS
  Filled 2015-08-02: qty 1

## 2015-08-02 MED ORDER — HYDROMORPHONE HCL 1 MG/ML IJ SOLN
1.0000 mg | Freq: Once | INTRAMUSCULAR | Status: AC
  Administered 2015-08-02: 1 mg via INTRAVENOUS
  Filled 2015-08-02: qty 1

## 2015-08-02 MED ORDER — HYDROCODONE-ACETAMINOPHEN 5-325 MG PO TABS
1.0000 | ORAL_TABLET | ORAL | Status: DC | PRN
  Administered 2015-08-02 – 2015-08-05 (×11): 2 via ORAL
  Filled 2015-08-02 (×11): qty 2

## 2015-08-02 MED ORDER — SODIUM CHLORIDE 0.9 % IJ SOLN
3.0000 mL | Freq: Two times a day (BID) | INTRAMUSCULAR | Status: DC
  Administered 2015-08-04 – 2015-08-05 (×2): 3 mL via INTRAVENOUS

## 2015-08-02 MED ORDER — SODIUM CHLORIDE 0.9 % IJ SOLN: 3.0000 mL | INTRAMUSCULAR | Status: DC | PRN

## 2015-08-02 NOTE — ED Provider Notes (Signed)
CSN: [id number]     Arrival date & time 08/02/15  6144 History   First MD Initiated Contact with Patient 08/02/15 1023     Chief Complaint  Patient presents with  . Neck Injury  . Back Injury     (Consider location/radiation/quality/duration/timing/severity/associated sxs/prior Treatment) Patient is a 44 y.o. male presenting with neck injury. The history is provided by medical records and the EMS personnel.  Neck Injury Associated symptoms include arthralgias and neck pain.    This is a 44 year old male with history of depression and anxiety, presenting to the ED after neck and back injury. 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 He states he fell to the ground onto his back, but bale did not land on top of him. He denies head injury or loss of consciousness.  Patient was placed in c-collar on EMS arrival.  He complains of generalized back pain, worst in his neck.  States he has increased pain with movement of his extremities, however he is able to move his arms and legs freely. He denies numbness or weakness of his extremities. No loss of bowel or bladder control. He states he also has some left rib pain. No other chest pain or shortness of breath. No abdominal pain. 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 He was given fentanyl and route without relief.  Past Medical History  Diagnosis Date  . Depression   . Anxiety    Past Surgical History  Procedure Laterality Date  . Vasectomy     Family History  Problem Relation Age of Onset  . Hypertension Mother   . COPD Father   . Heart disease Father   . Hyperlipidemia Father    Social History  Substance Use Topics  . Smoking status: Former Smoker -- 0.50 packs/day    Types: Cigarettes  . Smokeless tobacco: Not on file  . Alcohol Use: Yes    Review of  Systems  Musculoskeletal: Positive for back pain, arthralgias and neck pain.  All other systems reviewed and are negative.     Allergies  Review of patient's allergies indicates no known allergies.  Home Medications   Prior to Admission medications   Medication Sig Start Date End Date Taking? Authorizing Provider  chlorpheniramine-HYDROcodone (TUSSIONEX PENNKINETIC ER) 10-8 MG/5ML LQCR Take 5 mLs by mouth every 12 (twelve) hours as needed (cough). 08/22/12   Heather Jaquita Rector, PA-C  escitalopram (LEXAPRO) 20 MG tablet Take 20 mg by mouth daily.    Historical Provider, MD  meloxicam (MOBIC) 7.5 MG tablet Take 1-2 tablets (7.5-15 mg total) by mouth daily. 05/17/14   Shade Flood, MD  oseltamivir (TAMIFLU) 75 MG capsule Take 1 capsule (75 mg total) by mouth 2 (two) times daily. 08/22/12   Heather M Marte, PA-C   BP 144/101 mmHg  Pulse 89  Temp(Src) 98.1 F (36.7 C) (Oral)  Resp 20  SpO2 100%   Physical Exam  Constitutional: He is oriented to person, place, and time. He appears well-developed and well-nourished. No distress.  Appears uncomfortable, repetitively tapping both feet during exam  HENT:  Head: Normocephalic and atraumatic.  Mouth/Throat: Oropharynx is clear and moist.  No visible signs of head trauma  Eyes: Conjunctivae and EOM are normal. Pupils are equal, round, and reactive to light.  Neck:  c-collar in place  Cardiovascular: Normal rate, regular rhythm and normal heart sounds.   Pulmonary/Chest: Effort normal and breath sounds normal. No respiratory distress. He has no wheezes.    Left lower anterior ribs with tenderness; no bruising or deformities noted; lungs clear bilaterally  Abdominal: Soft. Bowel sounds are normal. There is no tenderness. There is no guarding.  Musculoskeletal: Normal range of motion.       Cervical back: He exhibits tenderness, bony tenderness and pain.       Thoracic back: He exhibits tenderness and bony tenderness.       Lumbar back: He  exhibits tenderness and bony tenderness.  exquisite tenderness of cervical spine; spasm noted of paraspinal muscles bilaterally; c- collar remains in place, ROM not tested; normal grip strength of BUE Mild TTP of thoracic and lumbar spine without acute deformity; normal strength and sensation of BLE  Neurological: He is alert and oriented to person, place, and time.  Awake, alert, oriented x3; moving all 4 extremities spontaneously and when prompted; no apparent loss of strength or sensation of extremities  Skin: Skin is warm and dry. He is not diaphoretic.  Psychiatric: He has a normal mood and affect.  Nursing note and vitals reviewed.   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 Pain. Initial encounter. EXAM: LEFT RIBS AND CHEST - 3+ VIEW COMPARISON:  None. FINDINGS: AP supine view of the chest and multiple views of the left ribs. Lung volumes are within normal limits. Mild elevation the right hemidiaphragm. Normal cardiac size and mediastinal contours. Visualized tracheal air column is within normal limits. Allowing for portable technique, the lungs are clear. Bone mineralization is within normal limits. BB marker placed at the anterior left sixth or seventh rib level. No displaced left rib fracture identified. No acute osseous abnormality identified. IMPRESSION: No acute cardiopulmonary abnormality or acute traumatic injury identified. No displaced left rib fracture identified. Electronically Signed   By: Odessa FlemingH  Hall M.D.   On: 08/02/2015 11:22   Dg Thoracic Spine 2 View  08/02/2015  CLINICAL DATA:  500 pound bail of diapers fell on patient EXAM: THORACIC SPINE 3 VIEWS COMPARISON:  None. FINDINGS: Frontal, lateral, and swimmer's views were obtained. There is  no fracture or spondylolisthesis. Disc spaces appear intact. No erosive change or paraspinous lesion. IMPRESSION: No fracture or dislocation.  No appreciable arthropathic change. Electronically Signed   By: Bretta BangWilliam  Woodruff III M.D.   On: 08/02/2015 11:18   Dg Lumbar Spine Complete  08/02/2015  CLINICAL DATA:  500 pound bail of diapers fell upon patient EXAM: LUMBAR SPINE - COMPLETE 4+ VIEW COMPARISON:  None. FINDINGS: Frontal, lateral, spot lumbosacral lateral, and bilateral oblique views were obtained. There are 5 non-rib-bearing lumbar type vertebral bodies. T12 ribs are hypoplastic. There is no fracture or spondylolisthesis. There is slight disc space narrowing at L4-5. Other disc spaces appear normal. There is slight facet osteoarthritic change at L5-S1 bilaterally. 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 No fracture or spondylolisthesis. Electronically Signed   By: Bretta BangWilliam  Woodruff III M.D.   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 Pain.  Initial encounter. EXAM: CT CERVICAL SPINE WITHOUT CONTRAST TECHNIQUE: Multidetector CT imaging of the cervical spine was performed without intravenous contrast. Multiplanar CT image reconstructions were also generated. COMPARISON:  Head CT without contrast 10/20/2013. Cervical spine MRI 07/11/2012. FINDINGS: Numerous cervical spine posterior element fractures have occurred. Detailed below. The skullbase alignment remains normal. No atlanto-occipital dissociation. Cervicothoracic junction alignment is within normal limits. There is a small volume epidural hemorrhage posteriorly and laterally on the right at C5, and also visible posteriorly at C6. Minimally displaced comminuted fractures of the lamina of C3, marginally involving the left superior and right inferior articulating facets.  Comminuted fractures of the lamina of C4, minimally displaced and tracking into both inferior articulating facets, more so the right. Comminuted and mildly distracted fractures of the lamina of C5. The anterior right C5 lamina is displaced inferiorly and impinges on the right C6 neural foramen (series 5, image 63 and sagittal image 28). Comminuted minimally displaced left and mildly displaced right side lamina fractures at C6 extending into the right inferior articulating facet. Here the right anterior lamina is displaced posteriorly. Minimally displaced fracture through the right C7 superior articulating facet with slight anterolisthesis of the right C6-C7 facet joint. See sagittal image 22. The pedicles at each of these levels appear intact. No cervical vertebral body fracture identified. Visualized upper thoracic levels appear intact. Vertebral body alignment is maintained. There is a small chronic endplate osteophyte inferiorly anteriorly at C5. Negative visualized lung apices. Negative non contrast superior mediastinum. Ventral noncontrast paraspinal soft tissues are within normal limits. Negative visualized posterior fossa. Visualized paranasal sinuses and mastoids are clear. IMPRESSION: 1. Extensive cervical spine posterior element fractures from the C3 to the C7 level 2. The injuries appear most pronounced from the C5 to the C7 level detailed above, but no displacement of the right C5 lamina toward the right C6 neural foramen, and fractures through the right C6-C7 facet joint with mild anterior subluxation. 3. No associated anterior column or vertebral body fracture identified. No spondylolisthesis. 4. Associated small volume epidural hemorrhage posteriorly and/or laterally at C5 and C6. Electronically Signed   By: Odessa Fleming M.D.   On: 08/02/2015 12:24   I have personally reviewed and evaluated these images and lab results as part of my medical decision-making.   EKG Interpretation None      MDM    Final diagnoses:  Multiple fractures of cervical spine, initial encounter [facility])   44 year old male here with neck and back injuries after being hit by a bale of diapers at work and fell to his back. He was hit in the back, bale did not land on top of him.  He denies head injury or LOC.  Now reports diffuse neck and back pain.  Hx of herniated cervical discs.  Patient is able to move all 4 extremities, no apparent loss in strength/sensation of extremities.  Also has some left rib pain.  Will obtain CT cervical spine and plain films of thoracic and lumbar spine as well as left ribs.  12:25 PM Call from radiology, Dr. Margo Aye-- multiple spine fractures, reasonably unstable.  Some displacement of posterior fragments noted.  Small amount of epidural hemorrhage.  Remainder of films negative for acute findings.  Case discussed with on call neurosurgery, Dr. Yetta Barre-- is about to start case in OR, will come to ED to evaluate patient afterwards to determine plan of care.    For now, patient will be placed in aspen collar and pre-op labs sent.  Patient was  given small dose of toradol earlier today as he was unable to lay still to get imaging studies due to pain.  Toradol did ultimately relieve his pain, however given small amount of bleeding will hold further doses of this as to not increase bleeding risk.  Patient and wife updated, in agreement with plan of care.  After evaluation with Dr. Yetta Barre in ED, patient will be admitted for overnight observation given numerous fractures to ensure patient is able to move adequately and does not develop any neurologic deficits.  Pain controlled at this time.  PRN morphine ordered until transfer to floor.  Garlon Hatchet, PA-C 08/02/15 1520  Rolan Bucco, MD 08/02/15 [phone]

## 2015-08-02 NOTE — H&P (Signed)
Reason for Consult: Cervical spine fracture Referring Physician: EDP  Joshua Hardin is an 44 y.o. male.   HPI:  44 year old male who was apparently struck in the back of the neck earlier today by 500 pound package of compressed baby diapers. He had immediate onset of neck pain and some difficulty with grip. He had right arm pain that has improved with morphine. His grip has improved in the ED. CT scan showed posterior element fractures from C3-C7 and neurosurgical evaluation was requested. He is in a cervical collar. His pain is moderate. He has no arm pain or numbness or tingling at this moment.  Past Medical History  Diagnosis Date  . Depression   . Anxiety     Past Surgical History  Procedure Laterality Date  . Vasectomy      No Known Allergies  Social History  Substance Use Topics  . Smoking status: Former Smoker -- 0.50 packs/day    Types: Cigarettes  . Smokeless tobacco: Not on file  . Alcohol Use: Yes    Family History  Problem Relation Age of Onset  . Hypertension Mother   . COPD Father   . Heart disease Father   . Hyperlipidemia Father      Review of Systems  Positive ROS: Negative  All other systems have been reviewed and were otherwise negative with the exception of those mentioned in the HPI and as above.  Objective: Vital signs in last 24 hours: Temp:  [98.1 F (36.7 C)] 98.1 F (36.7 C) (12/23 1011) Pulse Rate:  [60-91] 82 (12/23 1430) Resp:  [16-20] 16 (12/23 1430) BP: (98-144)/(62-101) 116/69 mmHg (12/23 1430) SpO2:  [97 %-100 %] 99 % (12/23 1430)  General Appearance: Alert, cooperative, no distress, appears stated age Head: Normocephalic, without obvious abnormality, atraumatic Eyes: PERRL, conjunctiva/corneas clear, EOM's intact      Neck: In a cervical collar Lungs: , respirations unlabored Heart: Regular rate and rhythm Abdomen: Soft Extremities: Extremities normal, atraumatic, no cyanosis or edema   NEUROLOGIC:   Mental status:  A&O x4, no aphasia, good attention span, Memory and fund of knowledge Motor Exam - grossly normal except for handgrips may be 4 out of 5, normal tone and bulk Sensory Exam - grossly normal Reflexes: symmetric, no pathologic reflexes, No Hoffman's, No clonus Coordination - grossly normal Gait - grossly normal Balance - grossly normal Cranial Nerves: I: smell Not tested  II: visual acuity  OS: na    OD: na  II: visual fields Full to confrontation  II: pupils Equal, round, reactive to light  III,VII: ptosis None  III,IV,VI: extraocular muscles  Full ROM  V: mastication Normal  V: facial light touch sensation  Normal  V,VII: corneal reflex  Present  VII: facial muscle function - upper  Normal  VII: facial muscle function - lower Normal  VIII: hearing Not tested  IX: soft palate elevation  Normal  IX,X: gag reflex Present  XI: trapezius strength  5/5  XI: sternocleidomastoid strength 5/5  XI: neck flexion strength  5/5  XII: tongue strength  Normal    Data Review Lab Results  Component Value Date   WBC 11.3* 08/02/2015   HGB 14.8 08/02/2015   HCT 42.1 08/02/2015   MCV 90.5 08/02/2015   PLT 223 08/02/2015   Lab Results  Component Value Date   NA 138 08/02/2015   K 3.7 08/02/2015   CL 103 08/02/2015   CO2 22 08/02/2015   BUN 13 08/02/2015   CREATININE 0.97 08/02/2015  GLUCOSE 81 08/02/2015   Lab Results  Component Value Date   INR 1.22 08/02/2015    Radiology: Dg Ribs Unilateral W/chest Left  08/02/2015  CLINICAL DATA:  44 year old male status post blunt trauma when struck by 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 Pain. Initial encounter. EXAM: LEFT RIBS AND CHEST - 3+ VIEW COMPARISON:  None. FINDINGS: AP supine view of the chest and multiple views of the left ribs. Lung volumes are within normal limits. Mild elevation the right hemidiaphragm. Normal cardiac size and mediastinal contours. Visualized tracheal air column is within normal limits. Allowing for  portable technique, the lungs are clear. Bone mineralization is within normal limits. BB marker placed at the anterior left sixth or seventh rib level. No displaced left rib fracture identified. No acute osseous abnormality identified. IMPRESSION: No acute cardiopulmonary abnormality or acute traumatic injury identified. No displaced left rib fracture identified. Electronically Signed   By: Odessa FlemingH  Hall M.D.   On: 08/02/2015 11:22   Dg Thoracic Spine 2 View  08/02/2015  CLINICAL DATA:  500 pound bail of diapers fell on patient EXAM: THORACIC SPINE 3 VIEWS COMPARISON:  None. FINDINGS: Frontal, lateral, and swimmer's views were obtained. There is no fracture or spondylolisthesis. Disc spaces appear intact. No erosive change or paraspinous lesion. IMPRESSION: No fracture or dislocation.  No appreciable arthropathic change. Electronically Signed   By: Bretta BangWilliam  Woodruff III M.D.   On: 08/02/2015 11:18   Dg Lumbar Spine Complete  08/02/2015  CLINICAL DATA:  500 pound bail of diapers fell upon patient EXAM: LUMBAR SPINE - COMPLETE 4+ VIEW COMPARISON:  None. FINDINGS: Frontal, lateral, spot lumbosacral lateral, and bilateral oblique views were obtained. There are 5 non-rib-bearing lumbar type vertebral bodies. T12 ribs are hypoplastic. There is no fracture or spondylolisthesis. There is slight disc space narrowing at L4-5. Other disc spaces appear normal. There is slight facet osteoarthritic change at L5-S1 bilaterally. 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 No fracture or spondylolisthesis. Electronically Signed   By: Bretta BangWilliam  Woodruff III M.D.   On: 08/02/2015 11:21   Ct Cervical Spine Wo Contrast  08/02/2015  ADDENDUM REPORT: 08/02/2015 12:34 ADDENDUM: Study discussed by telephone with PA LISA SANDERS on 08/02/2015 at 1226 hours. Electronically Signed   By: Odessa FlemingH  Hall M.D.   On: 08/02/2015 12:34  08/02/2015  CLINICAL DATA:  44 year old male status post blunt trauma when struck by a 500  pound bale will of material an warehouse. Pain. Initial encounter. EXAM: CT CERVICAL SPINE WITHOUT CONTRAST TECHNIQUE: Multidetector CT imaging of the cervical spine was performed without intravenous contrast. Multiplanar CT image reconstructions were also generated. COMPARISON:  Head CT without contrast 10/20/2013. Cervical spine MRI 07/11/2012. FINDINGS: Numerous cervical spine posterior element fractures have occurred. Detailed below. The skullbase alignment remains normal. No atlanto-occipital dissociation. Cervicothoracic junction alignment is within normal limits. There is a small volume epidural hemorrhage posteriorly and laterally on the right at C5, and also visible posteriorly at C6. Minimally displaced comminuted fractures of the lamina of C3, marginally involving the left superior and right inferior articulating facets. Comminuted fractures of the lamina of C4, minimally displaced and tracking into both inferior articulating facets, more so the right. Comminuted and mildly distracted fractures of the lamina of C5. The anterior right C5 lamina is displaced inferiorly and impinges on the right C6 neural foramen (series 5, image 63 and sagittal image 28). Comminuted minimally displaced left and mildly displaced right side lamina fractures at C6 extending into  the right inferior articulating facet. Here the right anterior lamina is displaced posteriorly. Minimally displaced fracture through the right C7 superior articulating facet with slight anterolisthesis of the right C6-C7 facet joint. See sagittal image 22. The pedicles at each of these levels appear intact. No cervical vertebral body fracture identified. Visualized upper thoracic levels appear intact. Vertebral body alignment is maintained. There is a small chronic endplate osteophyte inferiorly anteriorly at C5. Negative visualized lung apices. Negative non contrast superior mediastinum. Ventral noncontrast paraspinal soft tissues are within normal  limits. Negative visualized posterior fossa. Visualized paranasal sinuses and mastoids are clear. IMPRESSION: 1. Extensive cervical spine posterior element fractures from the C3 to the C7 level 2. The injuries appear most pronounced from the C5 to the C7 level detailed above, but no displacement of the right C5 lamina toward the right C6 neural foramen, and fractures through the right C6-C7 facet joint with mild anterior subluxation. 3. No associated anterior column or vertebral body fracture identified. No spondylolisthesis. 4. Associated small volume epidural hemorrhage posteriorly and/or laterally at C5 and C6. Electronically Signed: By: Odessa Fleming M.D. On: 08/02/2015 12:24   CT scan of cervical spine was reviewed  Assessment/Plan: Posterior element cervical spine fractures. Hopefully this will heal in a collar. We'll admit him for pain control and mobilization. We will then follow him with serial x-rays. I do not believe he needs an MRI at this point. No need for steroids.   JONES,DAVID S 08/02/2015 3:28 PM

## 2015-08-02 NOTE — ED Notes (Signed)
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 Pt found on back. Pt c/o bilateral shoulder pain, left side pain, right lower arm/elbow pain, neck pain. Pt hx near herniated disc in c-spine. Pt able to move all extremities. Denies lower back pain. Pt given fentanyl w/o relief.

## 2015-08-03 MED ORDER — INFLUENZA VAC SPLIT QUAD 0.5 ML IM SUSY
0.5000 mL | PREFILLED_SYRINGE | INTRAMUSCULAR | Status: DC
  Filled 2015-08-03: qty 0.5

## 2015-08-03 NOTE — Progress Notes (Signed)
Pt sat up in the chair for about 20 minutes for dinner. Pt stated that he was getting tired and wanted to return to bed. Assist x1 back to bed.
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
## 2015-08-03 NOTE — Progress Notes (Signed)
OT Cancellation Note  Patient Details Name: Joshua Hardin MRN: [id number] DOB: 07/14/1971   Cancelled Treatment:    Reason Eval/Treat Not Completed: Other (comment). Pt just went through having to be in/out cathed and in pain in general with pain meds given per pt/wife about 20 minutes ago. Will see pt for eval at later time.  Evette GeorgesLeonard, [name] [name] [phone] 08/03/2015, 11:35 AM

## 2015-08-04 MED ORDER — INFLUENZA VAC SPLIT QUAD 0.5 ML IM SUSY
0.5000 mL | PREFILLED_SYRINGE | INTRAMUSCULAR | Status: AC
  Administered 2015-08-05: 0.5 mL via INTRAMUSCULAR
  Filled 2015-08-04: qty 0.5

## 2015-08-04 NOTE — Progress Notes (Signed)
Patient ID: Joshua MarcusMichael L Hardin, male   DOB: 04/10/1971, 44 y.o.   MRN: [id number] Afeb, vss Biggest issue is some difficulty urinating, though he has ambulated fine. Has a lot less pain than yesterday. Will increase activity, and hopefully d/c tomorrow if urination is ok.
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
## 2015-08-05 MED ORDER — WHITE PETROLATUM GEL
Status: AC
  Filled 2015-08-05: qty 1

## 2015-08-05 MED ORDER — HYDROCODONE-ACETAMINOPHEN 5-325 MG PO TABS: 1.0000 | ORAL_TABLET | ORAL | Status: AC | PRN

## 2015-08-05 MED ORDER — CYCLOBENZAPRINE HCL 10 MG PO TABS: 10.0000 mg | ORAL_TABLET | Freq: Three times a day (TID) | ORAL | Status: AC | PRN

## 2015-08-05 NOTE — Progress Notes (Signed)
OT Cancellation Note  Patient Details Name: Joshua Hardin MRN: [id number] DOB: 10/13/1970   Cancelled Treatment:    Reason Eval/Treat Not Completed: Patient reports being independent in the room and was getting ready to take a shower with assistance from wife. Therapist discussed pt/wife's questions regarding brace wear in shower and DME needs for bathroom. Pt and wife have no further questions for OT. Will continue to follow until d/c.  Nils PyleJulia Hailynn Slovacek, OTR/L Pager: [phone] 08/05/2015, 11:52 AM

## 2015-08-05 NOTE — Discharge Summary (Cosign Needed)
Date of admission: 08/02/15  Date of discharge: 08/05/15  Admission diagnosis: Cervical lamina fractures  Discharge diagnosis: Same  Hospital course: He was admitted with posterior element fractures C4-7.  He was placed in a collar.  Over the coming days his ambulation improved.  He is discharged home in stable medical and neurological condition.  Follow up: Dr. Yetta BarreJones in 3 weeks  Discharge meds: Resume prior meds, flexeril for spasms, vicodin for pain

## 2015-08-05 NOTE — Progress Notes (Signed)
Doing well Full strength Collar fits well D/c home

## 2015-08-05 NOTE — Progress Notes (Signed)
Patient is discharged from room 5C20 at this time. Alert and in stable condition. IV site d/c'd per protocol. Instructions read to patient and wife with understanding verbalized. Left unit via wheelchair with all belongings at side.

## 2015-08-05 NOTE — Progress Notes (Signed)
PT Cancellation Note  Patient Details Name: Joshua Hardin MRN: [id number] DOB: 04/22/1971   Cancelled Treatment:    Reason Eval/Treat Not Completed: Pt reports that he has been independent in the room and walking in the hall this morning. Pt anticipates d/c home today, and declined practicing stairs again. Therapist discussed safety at home with use of DME and while negotiating stairs. Pt and wife have no further questions for PT. Will continue to follow until d/c.    Conni SlipperKirkman, Joshua Hardin 08/05/2015, 10:27 AM   Conni SlipperLaura Myonna Hardin, PT, DPT Acute Rehabilitation Services Pager: [phone]

## 2015-09-25 ENCOUNTER — Other Ambulatory Visit: Payer: Self-pay | Admitting: Neurological Surgery

## 2015-09-25 DIAGNOSIS — S129XXD Fracture of neck, unspecified, subsequent encounter: Secondary | ICD-10-CM

## 2015-10-25 ENCOUNTER — Inpatient Hospital Stay
Admission: RE | Admit: 2015-10-25 | Discharge: 2015-10-25 | Disposition: A | Discharge status: Home / Self Care | Payer: BLUE CROSS/BLUE SHIELD | Source: Ambulatory Visit | Attending: Neurological Surgery | Admitting: Neurological Surgery

## 2015-10-30 ENCOUNTER — Ambulatory Visit
Admission: RE | Admit: 2015-10-30 | Discharge: 2015-10-30 | Disposition: A | Discharge status: Home / Self Care | Payer: BLUE CROSS/BLUE SHIELD | Source: Ambulatory Visit | Attending: Neurological Surgery | Admitting: Neurological Surgery

## 2015-10-30 DIAGNOSIS — S129XXD Fracture of neck, unspecified, subsequent encounter: Secondary | ICD-10-CM

## 2016-03-20 DIAGNOSIS — M542 Cervicalgia: Secondary | ICD-10-CM | POA: Diagnosis not present

## 2016-03-23 DIAGNOSIS — M542 Cervicalgia: Secondary | ICD-10-CM | POA: Diagnosis not present

## 2016-03-26 DIAGNOSIS — M542 Cervicalgia: Secondary | ICD-10-CM | POA: Diagnosis not present

## 2016-04-03 DIAGNOSIS — M542 Cervicalgia: Secondary | ICD-10-CM | POA: Diagnosis not present

## 2016-04-06 DIAGNOSIS — M542 Cervicalgia: Secondary | ICD-10-CM | POA: Diagnosis not present

## 2016-04-10 DIAGNOSIS — M542 Cervicalgia: Secondary | ICD-10-CM | POA: Diagnosis not present

## 2016-04-14 DIAGNOSIS — M542 Cervicalgia: Secondary | ICD-10-CM | POA: Diagnosis not present

## 2016-04-17 DIAGNOSIS — M542 Cervicalgia: Secondary | ICD-10-CM | POA: Diagnosis not present

## 2016-04-30 DIAGNOSIS — M542 Cervicalgia: Secondary | ICD-10-CM | POA: Diagnosis not present

## 2016-07-17 DIAGNOSIS — M4692 Unspecified inflammatory spondylopathy, cervical region: Secondary | ICD-10-CM | POA: Diagnosis not present

## 2016-07-24 DIAGNOSIS — M47812 Spondylosis without myelopathy or radiculopathy, cervical region: Secondary | ICD-10-CM | POA: Diagnosis not present

## 2016-08-18 DIAGNOSIS — M47812 Spondylosis without myelopathy or radiculopathy, cervical region: Secondary | ICD-10-CM | POA: Diagnosis not present

## 2016-09-04 ENCOUNTER — Other Ambulatory Visit: Payer: Self-pay | Admitting: Physical Medicine and Rehabilitation

## 2016-09-04 DIAGNOSIS — M542 Cervicalgia: Secondary | ICD-10-CM

## 2016-09-07 DIAGNOSIS — F1020 Alcohol dependence, uncomplicated: Secondary | ICD-10-CM | POA: Diagnosis not present

## 2016-09-07 DIAGNOSIS — F3181 Bipolar II disorder: Secondary | ICD-10-CM | POA: Diagnosis not present

## 2016-09-11 ENCOUNTER — Ambulatory Visit
Admission: RE | Admit: 2016-09-11 | Discharge: 2016-09-11 | Disposition: A | Discharge status: Home / Self Care | Payer: BLUE CROSS/BLUE SHIELD | Source: Ambulatory Visit | Attending: Physical Medicine and Rehabilitation | Admitting: Physical Medicine and Rehabilitation

## 2016-09-11 DIAGNOSIS — M542 Cervicalgia: Secondary | ICD-10-CM

## 2016-09-11 DIAGNOSIS — F1020 Alcohol dependence, uncomplicated: Secondary | ICD-10-CM | POA: Diagnosis not present

## 2016-09-11 DIAGNOSIS — F3181 Bipolar II disorder: Secondary | ICD-10-CM | POA: Diagnosis not present

## 2016-09-18 DIAGNOSIS — M542 Cervicalgia: Secondary | ICD-10-CM | POA: Diagnosis not present

## 2016-09-25 DIAGNOSIS — M542 Cervicalgia: Secondary | ICD-10-CM | POA: Diagnosis not present

## 2016-09-29 DIAGNOSIS — F3181 Bipolar II disorder: Secondary | ICD-10-CM | POA: Diagnosis not present

## 2016-09-29 DIAGNOSIS — F1020 Alcohol dependence, uncomplicated: Secondary | ICD-10-CM | POA: Diagnosis not present

## 2016-10-05 DIAGNOSIS — M542 Cervicalgia: Secondary | ICD-10-CM | POA: Diagnosis not present

## 2016-10-06 DIAGNOSIS — F1020 Alcohol dependence, uncomplicated: Secondary | ICD-10-CM | POA: Diagnosis not present

## 2016-10-06 DIAGNOSIS — F3181 Bipolar II disorder: Secondary | ICD-10-CM | POA: Diagnosis not present

## 2016-10-15 DIAGNOSIS — M9902 Segmental and somatic dysfunction of thoracic region: Secondary | ICD-10-CM | POA: Diagnosis not present

## 2016-10-15 DIAGNOSIS — M9901 Segmental and somatic dysfunction of cervical region: Secondary | ICD-10-CM | POA: Diagnosis not present

## 2016-10-15 DIAGNOSIS — M791 Myalgia: Secondary | ICD-10-CM | POA: Diagnosis not present

## 2016-10-15 DIAGNOSIS — M50122 Cervical disc disorder at C5-C6 level with radiculopathy: Secondary | ICD-10-CM | POA: Diagnosis not present

## 2016-10-17 DIAGNOSIS — M9902 Segmental and somatic dysfunction of thoracic region: Secondary | ICD-10-CM | POA: Diagnosis not present

## 2016-10-17 DIAGNOSIS — M791 Myalgia: Secondary | ICD-10-CM | POA: Diagnosis not present

## 2016-10-17 DIAGNOSIS — M50122 Cervical disc disorder at C5-C6 level with radiculopathy: Secondary | ICD-10-CM | POA: Diagnosis not present

## 2016-10-17 DIAGNOSIS — M9901 Segmental and somatic dysfunction of cervical region: Secondary | ICD-10-CM | POA: Diagnosis not present

## 2016-10-20 DIAGNOSIS — M50122 Cervical disc disorder at C5-C6 level with radiculopathy: Secondary | ICD-10-CM | POA: Diagnosis not present

## 2016-10-20 DIAGNOSIS — M9902 Segmental and somatic dysfunction of thoracic region: Secondary | ICD-10-CM | POA: Diagnosis not present

## 2016-10-20 DIAGNOSIS — M791 Myalgia: Secondary | ICD-10-CM | POA: Diagnosis not present

## 2016-10-20 DIAGNOSIS — M9901 Segmental and somatic dysfunction of cervical region: Secondary | ICD-10-CM | POA: Diagnosis not present

## 2016-10-21 DIAGNOSIS — F3181 Bipolar II disorder: Secondary | ICD-10-CM | POA: Diagnosis not present

## 2016-10-21 DIAGNOSIS — F1020 Alcohol dependence, uncomplicated: Secondary | ICD-10-CM | POA: Diagnosis not present

## 2016-10-23 DIAGNOSIS — M9901 Segmental and somatic dysfunction of cervical region: Secondary | ICD-10-CM | POA: Diagnosis not present

## 2016-10-23 DIAGNOSIS — M791 Myalgia: Secondary | ICD-10-CM | POA: Diagnosis not present

## 2016-10-23 DIAGNOSIS — M9902 Segmental and somatic dysfunction of thoracic region: Secondary | ICD-10-CM | POA: Diagnosis not present

## 2016-10-23 DIAGNOSIS — M50122 Cervical disc disorder at C5-C6 level with radiculopathy: Secondary | ICD-10-CM | POA: Diagnosis not present

## 2016-10-26 DIAGNOSIS — M50122 Cervical disc disorder at C5-C6 level with radiculopathy: Secondary | ICD-10-CM | POA: Diagnosis not present

## 2016-10-26 DIAGNOSIS — M9902 Segmental and somatic dysfunction of thoracic region: Secondary | ICD-10-CM | POA: Diagnosis not present

## 2016-10-26 DIAGNOSIS — M9901 Segmental and somatic dysfunction of cervical region: Secondary | ICD-10-CM | POA: Diagnosis not present

## 2016-10-26 DIAGNOSIS — M791 Myalgia: Secondary | ICD-10-CM | POA: Diagnosis not present

## 2016-11-02 DIAGNOSIS — Z125 Encounter for screening for malignant neoplasm of prostate: Secondary | ICD-10-CM | POA: Diagnosis not present

## 2016-11-02 DIAGNOSIS — F3130 Bipolar disorder, current episode depressed, mild or moderate severity, unspecified: Secondary | ICD-10-CM | POA: Diagnosis not present

## 2016-11-02 DIAGNOSIS — M791 Myalgia: Secondary | ICD-10-CM | POA: Diagnosis not present

## 2016-11-02 DIAGNOSIS — Z0000 Encounter for general adult medical examination without abnormal findings: Secondary | ICD-10-CM | POA: Diagnosis not present

## 2016-11-02 DIAGNOSIS — M9901 Segmental and somatic dysfunction of cervical region: Secondary | ICD-10-CM | POA: Diagnosis not present

## 2016-11-02 DIAGNOSIS — M9902 Segmental and somatic dysfunction of thoracic region: Secondary | ICD-10-CM | POA: Diagnosis not present

## 2016-11-02 DIAGNOSIS — M50122 Cervical disc disorder at C5-C6 level with radiculopathy: Secondary | ICD-10-CM | POA: Diagnosis not present

## 2016-11-04 DIAGNOSIS — F3181 Bipolar II disorder: Secondary | ICD-10-CM | POA: Diagnosis not present

## 2016-11-04 DIAGNOSIS — F1020 Alcohol dependence, uncomplicated: Secondary | ICD-10-CM | POA: Diagnosis not present

## 2016-11-10 DIAGNOSIS — F1020 Alcohol dependence, uncomplicated: Secondary | ICD-10-CM | POA: Diagnosis not present

## 2016-11-10 DIAGNOSIS — F3181 Bipolar II disorder: Secondary | ICD-10-CM | POA: Diagnosis not present

## 2016-11-10 DIAGNOSIS — M50122 Cervical disc disorder at C5-C6 level with radiculopathy: Secondary | ICD-10-CM | POA: Diagnosis not present

## 2016-11-10 DIAGNOSIS — M9901 Segmental and somatic dysfunction of cervical region: Secondary | ICD-10-CM | POA: Diagnosis not present

## 2016-11-10 DIAGNOSIS — M791 Myalgia: Secondary | ICD-10-CM | POA: Diagnosis not present

## 2016-11-10 DIAGNOSIS — M9902 Segmental and somatic dysfunction of thoracic region: Secondary | ICD-10-CM | POA: Diagnosis not present

## 2016-11-16 DIAGNOSIS — M791 Myalgia: Secondary | ICD-10-CM | POA: Diagnosis not present

## 2016-11-16 DIAGNOSIS — M9901 Segmental and somatic dysfunction of cervical region: Secondary | ICD-10-CM | POA: Diagnosis not present

## 2016-11-16 DIAGNOSIS — M9902 Segmental and somatic dysfunction of thoracic region: Secondary | ICD-10-CM | POA: Diagnosis not present

## 2016-11-16 DIAGNOSIS — M50122 Cervical disc disorder at C5-C6 level with radiculopathy: Secondary | ICD-10-CM | POA: Diagnosis not present

## 2016-11-24 DIAGNOSIS — F3181 Bipolar II disorder: Secondary | ICD-10-CM | POA: Diagnosis not present

## 2016-11-24 DIAGNOSIS — F1020 Alcohol dependence, uncomplicated: Secondary | ICD-10-CM | POA: Diagnosis not present

## 2016-12-24 DIAGNOSIS — F1020 Alcohol dependence, uncomplicated: Secondary | ICD-10-CM | POA: Diagnosis not present

## 2016-12-24 DIAGNOSIS — F3181 Bipolar II disorder: Secondary | ICD-10-CM | POA: Diagnosis not present

## 2017-01-15 DIAGNOSIS — F3181 Bipolar II disorder: Secondary | ICD-10-CM | POA: Diagnosis not present

## 2017-01-15 DIAGNOSIS — F1020 Alcohol dependence, uncomplicated: Secondary | ICD-10-CM | POA: Diagnosis not present

## 2017-02-26 DIAGNOSIS — M50122 Cervical disc disorder at C5-C6 level with radiculopathy: Secondary | ICD-10-CM | POA: Diagnosis not present

## 2017-02-26 DIAGNOSIS — M9902 Segmental and somatic dysfunction of thoracic region: Secondary | ICD-10-CM | POA: Diagnosis not present

## 2017-02-26 DIAGNOSIS — M791 Myalgia: Secondary | ICD-10-CM | POA: Diagnosis not present

## 2017-02-26 DIAGNOSIS — M9901 Segmental and somatic dysfunction of cervical region: Secondary | ICD-10-CM | POA: Diagnosis not present

## 2017-03-05 DIAGNOSIS — M9901 Segmental and somatic dysfunction of cervical region: Secondary | ICD-10-CM | POA: Diagnosis not present

## 2017-03-05 DIAGNOSIS — M791 Myalgia: Secondary | ICD-10-CM | POA: Diagnosis not present

## 2017-03-05 DIAGNOSIS — M50122 Cervical disc disorder at C5-C6 level with radiculopathy: Secondary | ICD-10-CM | POA: Diagnosis not present

## 2017-03-05 DIAGNOSIS — M9902 Segmental and somatic dysfunction of thoracic region: Secondary | ICD-10-CM | POA: Diagnosis not present

## 2017-03-11 DIAGNOSIS — F3181 Bipolar II disorder: Secondary | ICD-10-CM | POA: Diagnosis not present

## 2017-03-11 DIAGNOSIS — F1020 Alcohol dependence, uncomplicated: Secondary | ICD-10-CM | POA: Diagnosis not present

## 2017-03-12 DIAGNOSIS — M9901 Segmental and somatic dysfunction of cervical region: Secondary | ICD-10-CM | POA: Diagnosis not present

## 2017-03-12 DIAGNOSIS — M9902 Segmental and somatic dysfunction of thoracic region: Secondary | ICD-10-CM | POA: Diagnosis not present

## 2017-03-12 DIAGNOSIS — M791 Myalgia: Secondary | ICD-10-CM | POA: Diagnosis not present

## 2017-03-12 DIAGNOSIS — M50122 Cervical disc disorder at C5-C6 level with radiculopathy: Secondary | ICD-10-CM | POA: Diagnosis not present

## 2017-03-18 DIAGNOSIS — F3181 Bipolar II disorder: Secondary | ICD-10-CM | POA: Diagnosis not present

## 2017-03-18 DIAGNOSIS — F1020 Alcohol dependence, uncomplicated: Secondary | ICD-10-CM | POA: Diagnosis not present

## 2017-03-19 DIAGNOSIS — M9901 Segmental and somatic dysfunction of cervical region: Secondary | ICD-10-CM | POA: Diagnosis not present

## 2017-03-19 DIAGNOSIS — M9902 Segmental and somatic dysfunction of thoracic region: Secondary | ICD-10-CM | POA: Diagnosis not present

## 2017-03-19 DIAGNOSIS — M791 Myalgia: Secondary | ICD-10-CM | POA: Diagnosis not present

## 2017-03-19 DIAGNOSIS — M50122 Cervical disc disorder at C5-C6 level with radiculopathy: Secondary | ICD-10-CM | POA: Diagnosis not present

## 2017-04-13 DIAGNOSIS — F1020 Alcohol dependence, uncomplicated: Secondary | ICD-10-CM | POA: Diagnosis not present

## 2017-04-13 DIAGNOSIS — F3181 Bipolar II disorder: Secondary | ICD-10-CM | POA: Diagnosis not present

## 2017-04-19 DIAGNOSIS — Z23 Encounter for immunization: Secondary | ICD-10-CM | POA: Diagnosis not present

## 2017-04-19 DIAGNOSIS — M542 Cervicalgia: Secondary | ICD-10-CM | POA: Diagnosis not present

## 2017-04-22 DIAGNOSIS — F1020 Alcohol dependence, uncomplicated: Secondary | ICD-10-CM | POA: Diagnosis not present

## 2017-04-22 DIAGNOSIS — F3181 Bipolar II disorder: Secondary | ICD-10-CM | POA: Diagnosis not present

## 2017-05-04 DIAGNOSIS — F1020 Alcohol dependence, uncomplicated: Secondary | ICD-10-CM | POA: Diagnosis not present

## 2017-05-04 DIAGNOSIS — F3181 Bipolar II disorder: Secondary | ICD-10-CM | POA: Diagnosis not present

## 2017-05-28 DIAGNOSIS — F1020 Alcohol dependence, uncomplicated: Secondary | ICD-10-CM | POA: Diagnosis not present

## 2017-05-28 DIAGNOSIS — F3181 Bipolar II disorder: Secondary | ICD-10-CM | POA: Diagnosis not present

## 2017-06-08 DIAGNOSIS — F1020 Alcohol dependence, uncomplicated: Secondary | ICD-10-CM | POA: Diagnosis not present

## 2017-06-08 DIAGNOSIS — F3181 Bipolar II disorder: Secondary | ICD-10-CM | POA: Diagnosis not present

## 2017-06-29 DIAGNOSIS — G894 Chronic pain syndrome: Secondary | ICD-10-CM | POA: Diagnosis not present

## 2017-06-29 DIAGNOSIS — M5481 Occipital neuralgia: Secondary | ICD-10-CM | POA: Diagnosis not present

## 2017-06-29 DIAGNOSIS — M542 Cervicalgia: Secondary | ICD-10-CM | POA: Diagnosis not present

## 2017-06-29 DIAGNOSIS — M7918 Myalgia, other site: Secondary | ICD-10-CM | POA: Diagnosis not present

## 2017-07-05 DIAGNOSIS — M9902 Segmental and somatic dysfunction of thoracic region: Secondary | ICD-10-CM | POA: Diagnosis not present

## 2017-07-05 DIAGNOSIS — M9901 Segmental and somatic dysfunction of cervical region: Secondary | ICD-10-CM | POA: Diagnosis not present

## 2017-07-05 DIAGNOSIS — M50122 Cervical disc disorder at C5-C6 level with radiculopathy: Secondary | ICD-10-CM | POA: Diagnosis not present

## 2017-07-05 DIAGNOSIS — M791 Myalgia, unspecified site: Secondary | ICD-10-CM | POA: Diagnosis not present

## 2017-07-07 DIAGNOSIS — F3181 Bipolar II disorder: Secondary | ICD-10-CM | POA: Diagnosis not present

## 2017-07-07 DIAGNOSIS — F1020 Alcohol dependence, uncomplicated: Secondary | ICD-10-CM | POA: Diagnosis not present

## 2017-07-08 DIAGNOSIS — M791 Myalgia, unspecified site: Secondary | ICD-10-CM | POA: Diagnosis not present

## 2017-07-08 DIAGNOSIS — M9901 Segmental and somatic dysfunction of cervical region: Secondary | ICD-10-CM | POA: Diagnosis not present

## 2017-07-08 DIAGNOSIS — M9902 Segmental and somatic dysfunction of thoracic region: Secondary | ICD-10-CM | POA: Diagnosis not present

## 2017-07-08 DIAGNOSIS — M50122 Cervical disc disorder at C5-C6 level with radiculopathy: Secondary | ICD-10-CM | POA: Diagnosis not present

## 2017-07-12 DIAGNOSIS — M50122 Cervical disc disorder at C5-C6 level with radiculopathy: Secondary | ICD-10-CM | POA: Diagnosis not present

## 2017-07-12 DIAGNOSIS — M9902 Segmental and somatic dysfunction of thoracic region: Secondary | ICD-10-CM | POA: Diagnosis not present

## 2017-07-12 DIAGNOSIS — M9901 Segmental and somatic dysfunction of cervical region: Secondary | ICD-10-CM | POA: Diagnosis not present

## 2017-07-12 DIAGNOSIS — M791 Myalgia, unspecified site: Secondary | ICD-10-CM | POA: Diagnosis not present

## 2017-07-13 DIAGNOSIS — M5481 Occipital neuralgia: Secondary | ICD-10-CM | POA: Diagnosis not present

## 2017-07-13 DIAGNOSIS — M7918 Myalgia, other site: Secondary | ICD-10-CM | POA: Diagnosis not present

## 2017-07-13 DIAGNOSIS — M542 Cervicalgia: Secondary | ICD-10-CM | POA: Diagnosis not present

## 2017-07-13 DIAGNOSIS — G894 Chronic pain syndrome: Secondary | ICD-10-CM | POA: Diagnosis not present

## 2017-07-16 DIAGNOSIS — M50122 Cervical disc disorder at C5-C6 level with radiculopathy: Secondary | ICD-10-CM | POA: Diagnosis not present

## 2017-07-16 DIAGNOSIS — M791 Myalgia, unspecified site: Secondary | ICD-10-CM | POA: Diagnosis not present

## 2017-07-16 DIAGNOSIS — M9901 Segmental and somatic dysfunction of cervical region: Secondary | ICD-10-CM | POA: Diagnosis not present

## 2017-07-16 DIAGNOSIS — M9902 Segmental and somatic dysfunction of thoracic region: Secondary | ICD-10-CM | POA: Diagnosis not present

## 2017-07-23 DIAGNOSIS — M9901 Segmental and somatic dysfunction of cervical region: Secondary | ICD-10-CM | POA: Diagnosis not present

## 2017-07-23 DIAGNOSIS — M791 Myalgia, unspecified site: Secondary | ICD-10-CM | POA: Diagnosis not present

## 2017-07-23 DIAGNOSIS — M50122 Cervical disc disorder at C5-C6 level with radiculopathy: Secondary | ICD-10-CM | POA: Diagnosis not present

## 2017-07-23 DIAGNOSIS — M9902 Segmental and somatic dysfunction of thoracic region: Secondary | ICD-10-CM | POA: Diagnosis not present

## 2017-07-30 DIAGNOSIS — M50122 Cervical disc disorder at C5-C6 level with radiculopathy: Secondary | ICD-10-CM | POA: Diagnosis not present

## 2017-07-30 DIAGNOSIS — M9902 Segmental and somatic dysfunction of thoracic region: Secondary | ICD-10-CM | POA: Diagnosis not present

## 2017-07-30 DIAGNOSIS — M791 Myalgia, unspecified site: Secondary | ICD-10-CM | POA: Diagnosis not present

## 2017-07-30 DIAGNOSIS — M9901 Segmental and somatic dysfunction of cervical region: Secondary | ICD-10-CM | POA: Diagnosis not present

## 2017-08-11 DIAGNOSIS — G894 Chronic pain syndrome: Secondary | ICD-10-CM | POA: Diagnosis not present

## 2017-08-11 DIAGNOSIS — M5481 Occipital neuralgia: Secondary | ICD-10-CM | POA: Diagnosis not present

## 2017-08-11 DIAGNOSIS — M7918 Myalgia, other site: Secondary | ICD-10-CM | POA: Diagnosis not present

## 2017-08-11 DIAGNOSIS — M542 Cervicalgia: Secondary | ICD-10-CM | POA: Diagnosis not present

## 2017-08-11 DIAGNOSIS — M47812 Spondylosis without myelopathy or radiculopathy, cervical region: Secondary | ICD-10-CM | POA: Diagnosis not present

## 2017-08-12 DIAGNOSIS — F3181 Bipolar II disorder: Secondary | ICD-10-CM | POA: Diagnosis not present

## 2017-08-12 DIAGNOSIS — F1020 Alcohol dependence, uncomplicated: Secondary | ICD-10-CM | POA: Diagnosis not present

## 2017-08-13 DIAGNOSIS — M9902 Segmental and somatic dysfunction of thoracic region: Secondary | ICD-10-CM | POA: Diagnosis not present

## 2017-08-13 DIAGNOSIS — M50122 Cervical disc disorder at C5-C6 level with radiculopathy: Secondary | ICD-10-CM | POA: Diagnosis not present

## 2017-08-13 DIAGNOSIS — M9901 Segmental and somatic dysfunction of cervical region: Secondary | ICD-10-CM | POA: Diagnosis not present

## 2017-08-13 DIAGNOSIS — M791 Myalgia, unspecified site: Secondary | ICD-10-CM | POA: Diagnosis not present

## 2017-08-25 DIAGNOSIS — M9901 Segmental and somatic dysfunction of cervical region: Secondary | ICD-10-CM | POA: Diagnosis not present

## 2017-08-25 DIAGNOSIS — M791 Myalgia, unspecified site: Secondary | ICD-10-CM | POA: Diagnosis not present

## 2017-08-25 DIAGNOSIS — M50122 Cervical disc disorder at C5-C6 level with radiculopathy: Secondary | ICD-10-CM | POA: Diagnosis not present

## 2017-08-25 DIAGNOSIS — M9902 Segmental and somatic dysfunction of thoracic region: Secondary | ICD-10-CM | POA: Diagnosis not present

## 2017-08-31 DIAGNOSIS — F1020 Alcohol dependence, uncomplicated: Secondary | ICD-10-CM | POA: Diagnosis not present

## 2017-08-31 DIAGNOSIS — F3181 Bipolar II disorder: Secondary | ICD-10-CM | POA: Diagnosis not present

## 2017-09-27 DIAGNOSIS — M50122 Cervical disc disorder at C5-C6 level with radiculopathy: Secondary | ICD-10-CM | POA: Diagnosis not present

## 2017-09-27 DIAGNOSIS — M791 Myalgia, unspecified site: Secondary | ICD-10-CM | POA: Diagnosis not present

## 2017-09-27 DIAGNOSIS — M9902 Segmental and somatic dysfunction of thoracic region: Secondary | ICD-10-CM | POA: Diagnosis not present

## 2017-09-27 DIAGNOSIS — M9901 Segmental and somatic dysfunction of cervical region: Secondary | ICD-10-CM | POA: Diagnosis not present

## 2017-10-05 DIAGNOSIS — M7918 Myalgia, other site: Secondary | ICD-10-CM | POA: Diagnosis not present

## 2017-10-05 DIAGNOSIS — G894 Chronic pain syndrome: Secondary | ICD-10-CM | POA: Diagnosis not present

## 2017-10-05 DIAGNOSIS — M542 Cervicalgia: Secondary | ICD-10-CM | POA: Diagnosis not present

## 2017-10-05 DIAGNOSIS — M5481 Occipital neuralgia: Secondary | ICD-10-CM | POA: Diagnosis not present

## 2017-10-20 DIAGNOSIS — F3181 Bipolar II disorder: Secondary | ICD-10-CM | POA: Diagnosis not present

## 2017-10-20 DIAGNOSIS — F1020 Alcohol dependence, uncomplicated: Secondary | ICD-10-CM | POA: Diagnosis not present

## 2017-10-25 DIAGNOSIS — M9901 Segmental and somatic dysfunction of cervical region: Secondary | ICD-10-CM | POA: Diagnosis not present

## 2017-10-25 DIAGNOSIS — M50122 Cervical disc disorder at C5-C6 level with radiculopathy: Secondary | ICD-10-CM | POA: Diagnosis not present

## 2017-10-25 DIAGNOSIS — M9902 Segmental and somatic dysfunction of thoracic region: Secondary | ICD-10-CM | POA: Diagnosis not present

## 2017-10-25 DIAGNOSIS — M791 Myalgia, unspecified site: Secondary | ICD-10-CM | POA: Diagnosis not present

## 2017-11-19 DIAGNOSIS — F3181 Bipolar II disorder: Secondary | ICD-10-CM | POA: Diagnosis not present

## 2017-11-19 DIAGNOSIS — F1020 Alcohol dependence, uncomplicated: Secondary | ICD-10-CM | POA: Diagnosis not present

## 2017-11-23 DIAGNOSIS — F3130 Bipolar disorder, current episode depressed, mild or moderate severity, unspecified: Secondary | ICD-10-CM | POA: Diagnosis not present

## 2017-11-23 DIAGNOSIS — R634 Abnormal weight loss: Secondary | ICD-10-CM | POA: Diagnosis not present

## 2017-11-23 DIAGNOSIS — Z0000 Encounter for general adult medical examination without abnormal findings: Secondary | ICD-10-CM | POA: Diagnosis not present

## 2017-11-23 DIAGNOSIS — M542 Cervicalgia: Secondary | ICD-10-CM | POA: Diagnosis not present

## 2017-11-25 DIAGNOSIS — G894 Chronic pain syndrome: Secondary | ICD-10-CM | POA: Diagnosis not present

## 2017-11-25 DIAGNOSIS — M7918 Myalgia, other site: Secondary | ICD-10-CM | POA: Diagnosis not present

## 2017-11-25 DIAGNOSIS — M5481 Occipital neuralgia: Secondary | ICD-10-CM | POA: Diagnosis not present

## 2017-11-25 DIAGNOSIS — M542 Cervicalgia: Secondary | ICD-10-CM | POA: Diagnosis not present

## 2018-01-04 DIAGNOSIS — F1020 Alcohol dependence, uncomplicated: Secondary | ICD-10-CM | POA: Diagnosis not present

## 2018-01-04 DIAGNOSIS — F3181 Bipolar II disorder: Secondary | ICD-10-CM | POA: Diagnosis not present

## 2018-01-26 DIAGNOSIS — F3181 Bipolar II disorder: Secondary | ICD-10-CM | POA: Diagnosis not present

## 2018-01-26 DIAGNOSIS — F1020 Alcohol dependence, uncomplicated: Secondary | ICD-10-CM | POA: Diagnosis not present

## 2018-02-11 DIAGNOSIS — Z8781 Personal history of (healed) traumatic fracture: Secondary | ICD-10-CM | POA: Diagnosis not present

## 2018-02-11 DIAGNOSIS — M50322 Other cervical disc degeneration at C5-C6 level: Secondary | ICD-10-CM | POA: Diagnosis not present

## 2018-02-11 DIAGNOSIS — M542 Cervicalgia: Secondary | ICD-10-CM | POA: Diagnosis not present

## 2018-02-11 DIAGNOSIS — M7918 Myalgia, other site: Secondary | ICD-10-CM | POA: Diagnosis not present

## 2018-02-11 DIAGNOSIS — M792 Neuralgia and neuritis, unspecified: Secondary | ICD-10-CM | POA: Diagnosis not present

## 2018-03-02 DIAGNOSIS — F3181 Bipolar II disorder: Secondary | ICD-10-CM | POA: Diagnosis not present

## 2018-03-02 DIAGNOSIS — F1020 Alcohol dependence, uncomplicated: Secondary | ICD-10-CM | POA: Diagnosis not present

## 2018-03-03 DIAGNOSIS — F3181 Bipolar II disorder: Secondary | ICD-10-CM | POA: Diagnosis not present

## 2018-03-03 DIAGNOSIS — F1020 Alcohol dependence, uncomplicated: Secondary | ICD-10-CM | POA: Diagnosis not present

## 2018-04-15 DIAGNOSIS — F3181 Bipolar II disorder: Secondary | ICD-10-CM | POA: Diagnosis not present

## 2018-04-15 DIAGNOSIS — F1020 Alcohol dependence, uncomplicated: Secondary | ICD-10-CM | POA: Diagnosis not present

## 2018-04-18 DIAGNOSIS — F1020 Alcohol dependence, uncomplicated: Secondary | ICD-10-CM | POA: Diagnosis not present

## 2018-04-18 DIAGNOSIS — F3181 Bipolar II disorder: Secondary | ICD-10-CM | POA: Diagnosis not present

## 2018-05-16 IMAGING — CT CT NECK W/O CM
4 of 5 series · 16 of 33 positions shown, 18 images · non-contrast
Comparison: CT cervical spine 10/30/2015

CLINICAL DATA: Neck pain

EXAM:
CT NECK WITHOUT CONTRAST
TECHNIQUE: Multidetector CT imaging of the neck was performed following the
standard protocol without intravenous contrast.

[Series 2: neck · axial · 0.52mm/px · z∈[-223,-79]mm · 4 of 122 slices shown]
[im 25/122  bone]
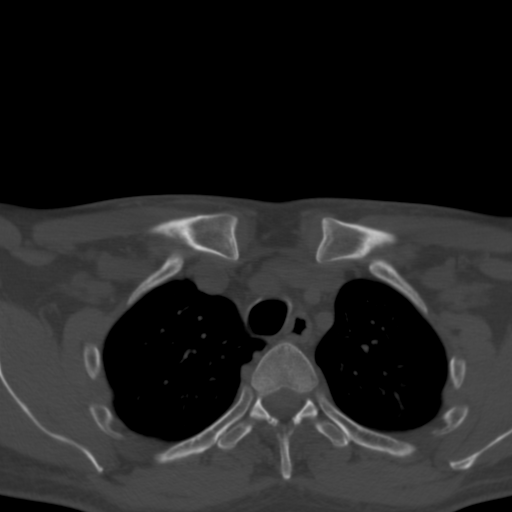
[im 49/122  bone]
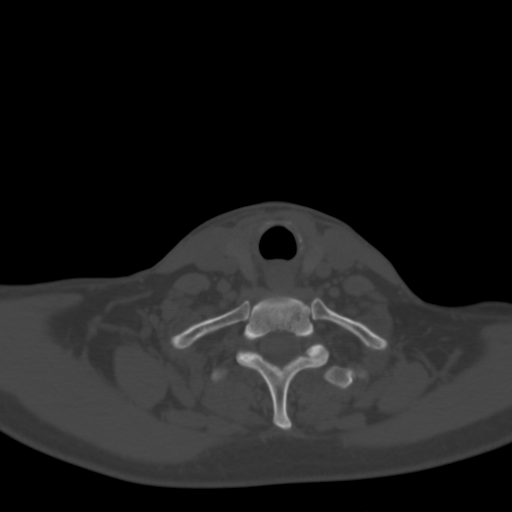
[im 73/122  bone]
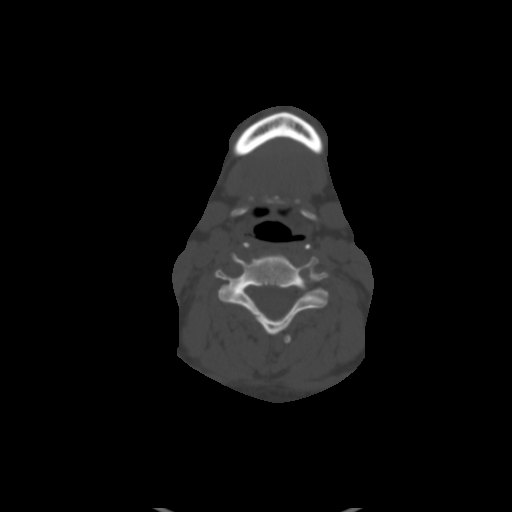
[im 97/122  bone]
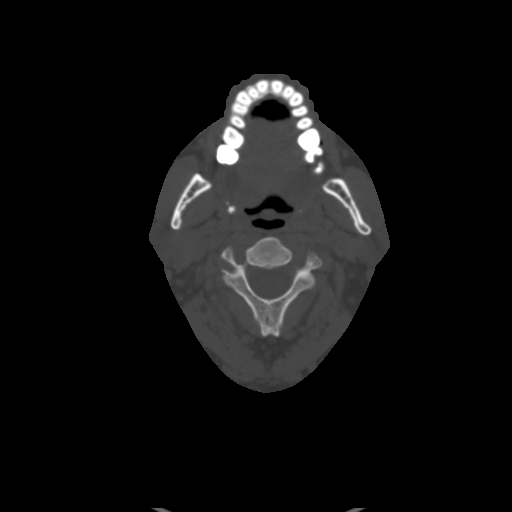

[Series 5: sag · sagittal · 0.47mm/px · 5 of 126 slices shown, 6 images]
[im 42/126  bone]
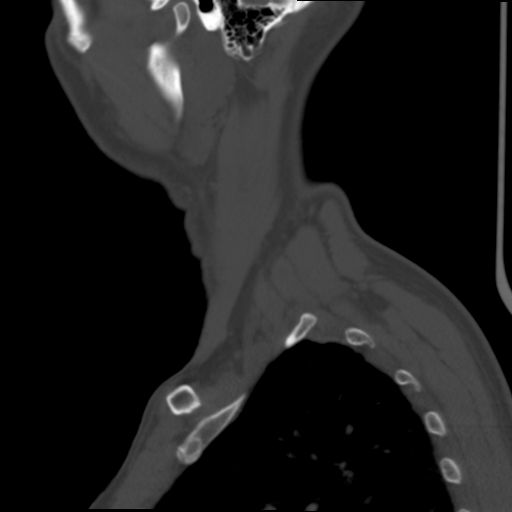
[im 53/126  bone]
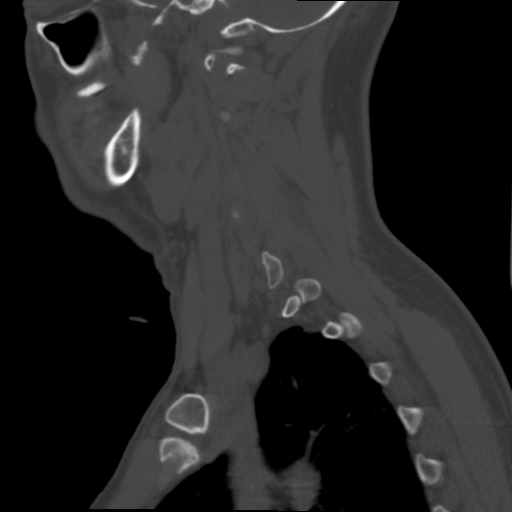
[im 63/126  soft-tissue]
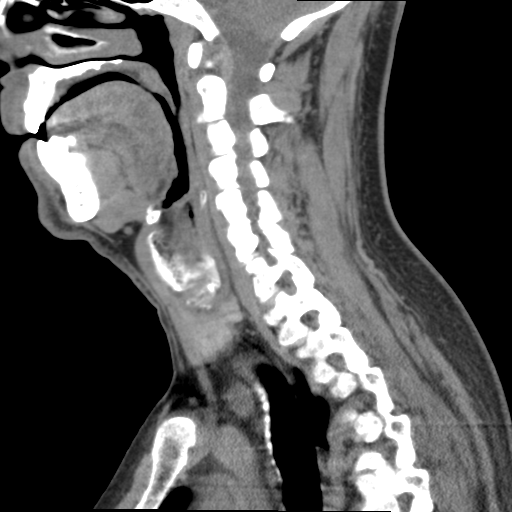
[im 63/126  bone]
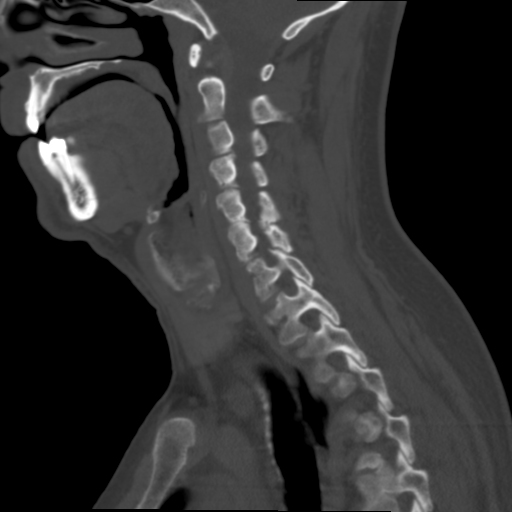
[im 73/126  bone]
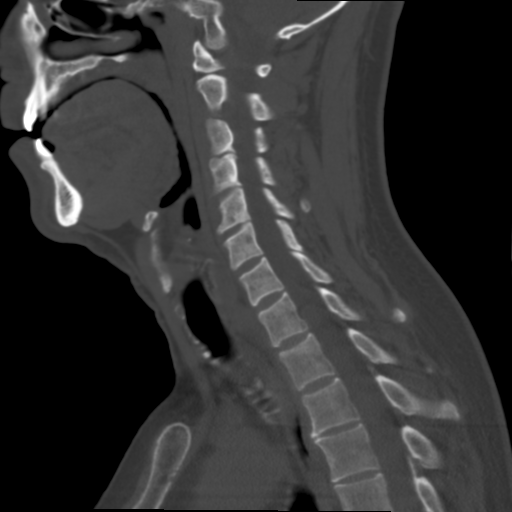
[im 84/126  bone]
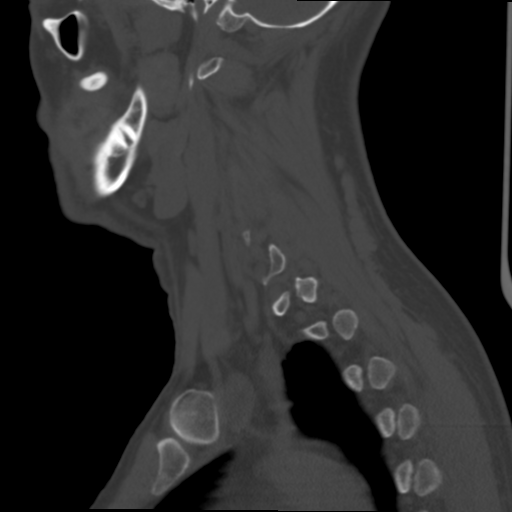

[Series 6: cor · coronal · 0.49mm/px · 3 of 123 slices shown]
[im 25/123  bone]
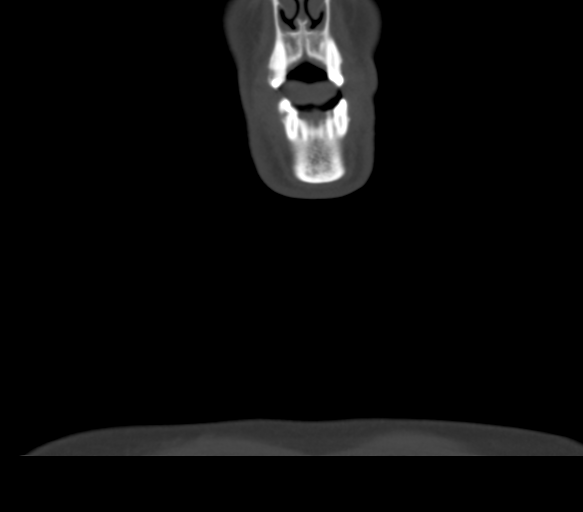
[im 49/123  bone]
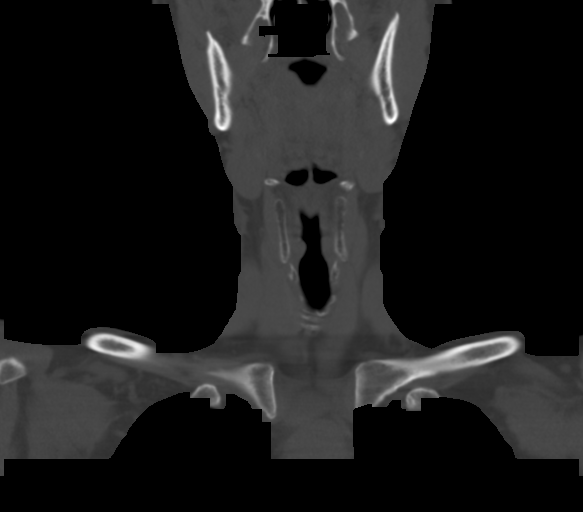
[im 74/123  bone]
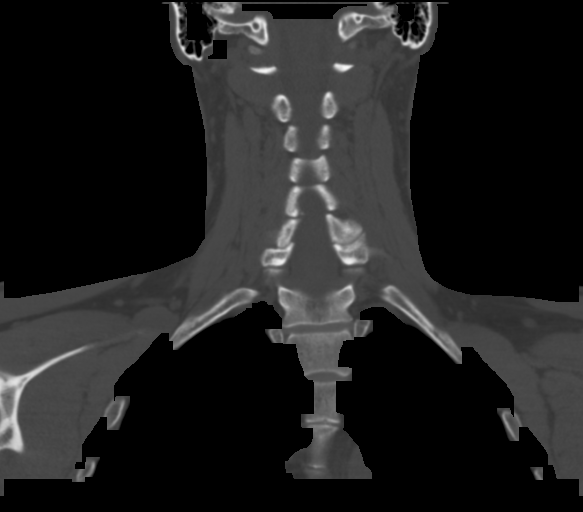

[Series 7: angled axial-oropharynx · axial · 0.39mm/px · z∈[-255,-94]mm · 4 of 143 slices shown, 5 images]
[im 29/143  soft-tissue]
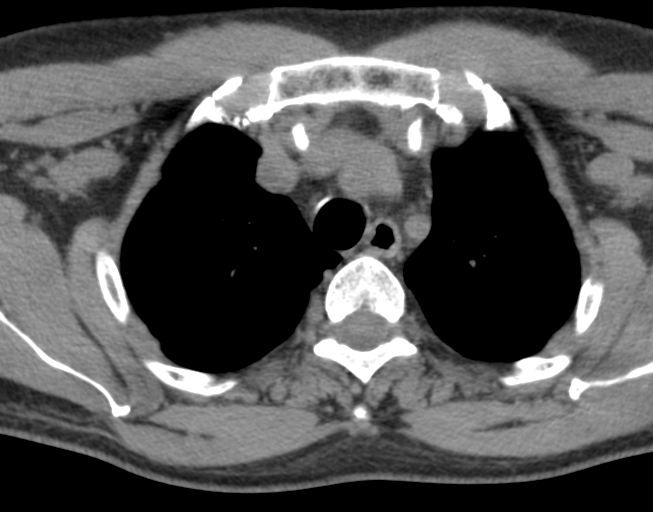
[im 29/143  bone]
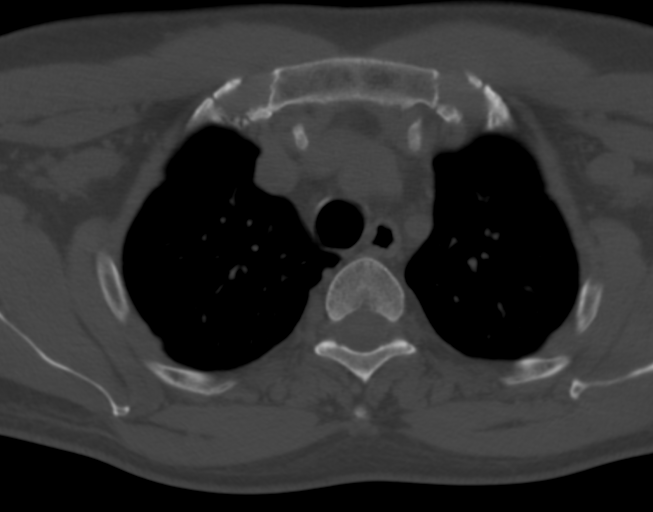
[im 57/143  bone]
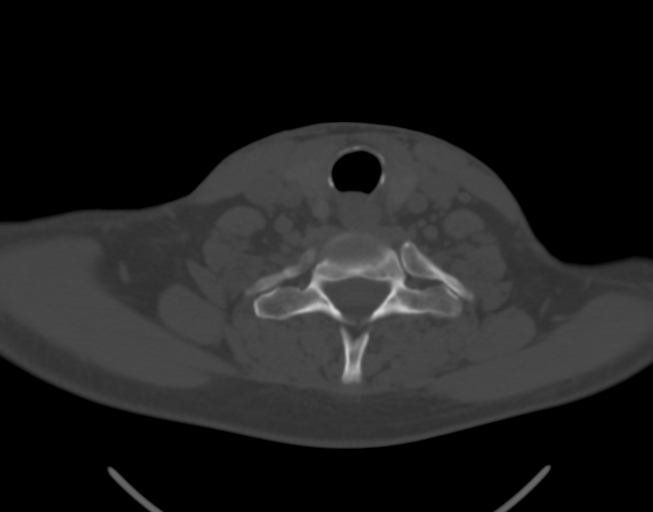
[im 86/143  bone]
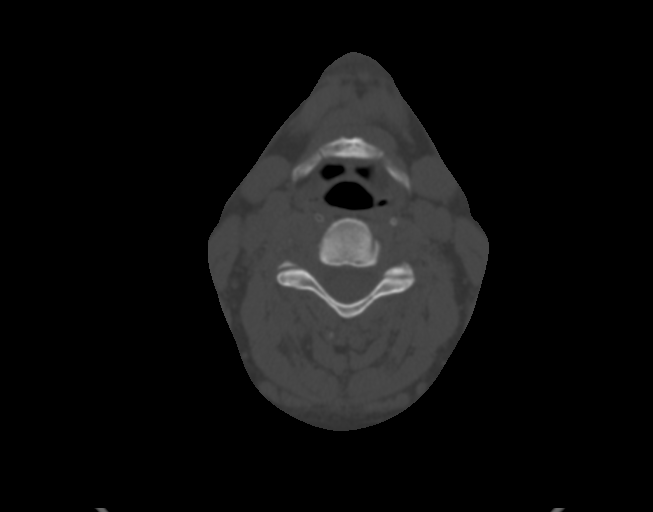
[im 114/143  bone]
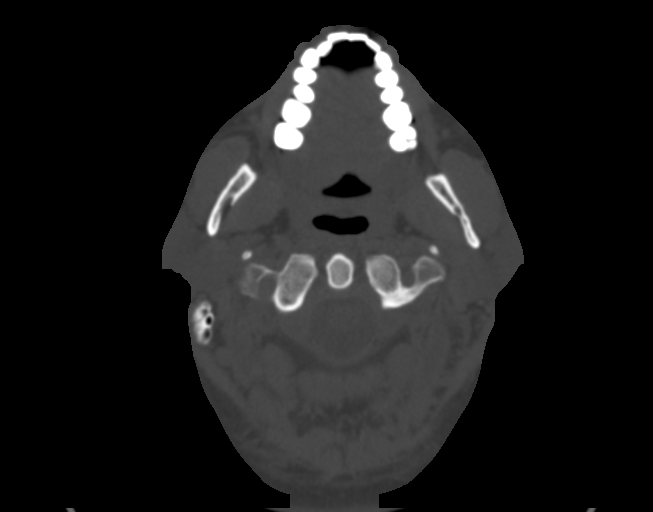

[16 of 33 positions shown; findings below may reference images not displayed]

FINDINGS: Pharynx and larynx: No pharyngeal mass or edema. Bilateral
tonsilliths. No peritonsillar abscess.

Salivary glands: Negative

Thyroid: Negative

Lymph nodes: No pathologic lymph nodes in the neck.

Vascular: Vascular patency not evaluated without intravenous
contrast.

Limited intracranial: Negative

Visualized orbits: Negative

Mastoids and visualized paranasal sinuses: Mild mucosal edema in the
maxillary sinus bilaterally. Remaining sinuses clear.

Skeleton: Normal alignment of the cervical spine. Chronic fractures
of the right C5 and C6 lamina appear healed. No significant spinal
or foraminal stenosis. Mild disc degeneration at C5-6 with minimal
spurring on the right. Other right-sided fractures have healed as
well. No displaced or acute fracture.

Upper chest: Negative

Other: None
IMPRESSION: Negative for soft tissue mass or adenopathy in the neck.

Healed cervical spine fractures on the right. No evidence of
pseudarthrosis or acute fracture.

## 2018-06-09 DIAGNOSIS — F1020 Alcohol dependence, uncomplicated: Secondary | ICD-10-CM | POA: Diagnosis not present

## 2018-06-09 DIAGNOSIS — F3181 Bipolar II disorder: Secondary | ICD-10-CM | POA: Diagnosis not present

## 2018-06-21 DIAGNOSIS — F1020 Alcohol dependence, uncomplicated: Secondary | ICD-10-CM | POA: Diagnosis not present

## 2018-06-21 DIAGNOSIS — F3181 Bipolar II disorder: Secondary | ICD-10-CM | POA: Diagnosis not present

## 2018-08-25 DIAGNOSIS — F3181 Bipolar II disorder: Secondary | ICD-10-CM | POA: Diagnosis not present

## 2018-08-25 DIAGNOSIS — F1020 Alcohol dependence, uncomplicated: Secondary | ICD-10-CM | POA: Diagnosis not present

## 2018-09-16 DIAGNOSIS — M5412 Radiculopathy, cervical region: Secondary | ICD-10-CM | POA: Diagnosis not present

## 2018-09-16 DIAGNOSIS — M5030 Other cervical disc degeneration, unspecified cervical region: Secondary | ICD-10-CM | POA: Diagnosis not present

## 2018-09-16 DIAGNOSIS — M542 Cervicalgia: Secondary | ICD-10-CM | POA: Diagnosis not present

## 2018-11-24 DIAGNOSIS — F1020 Alcohol dependence, uncomplicated: Secondary | ICD-10-CM | POA: Diagnosis not present

## 2018-11-24 DIAGNOSIS — M542 Cervicalgia: Secondary | ICD-10-CM | POA: Diagnosis not present

## 2018-11-24 DIAGNOSIS — F3181 Bipolar II disorder: Secondary | ICD-10-CM | POA: Diagnosis not present

## 2019-04-06 DIAGNOSIS — Z79899 Other long term (current) drug therapy: Secondary | ICD-10-CM | POA: Diagnosis not present

## 2019-04-06 DIAGNOSIS — Z0000 Encounter for general adult medical examination without abnormal findings: Secondary | ICD-10-CM | POA: Diagnosis not present

## 2019-04-06 DIAGNOSIS — Z23 Encounter for immunization: Secondary | ICD-10-CM | POA: Diagnosis not present

## 2019-04-06 DIAGNOSIS — Z8249 Family history of ischemic heart disease and other diseases of the circulatory system: Secondary | ICD-10-CM | POA: Diagnosis not present

## 2019-04-06 DIAGNOSIS — Z125 Encounter for screening for malignant neoplasm of prostate: Secondary | ICD-10-CM | POA: Diagnosis not present

## 2019-09-08 DIAGNOSIS — M542 Cervicalgia: Secondary | ICD-10-CM | POA: Diagnosis not present

## 2019-09-08 DIAGNOSIS — M5011 Cervical disc disorder with radiculopathy,  high cervical region: Secondary | ICD-10-CM | POA: Diagnosis not present

## 2019-09-08 DIAGNOSIS — M79622 Pain in left upper arm: Secondary | ICD-10-CM | POA: Diagnosis not present

## 2019-09-08 DIAGNOSIS — M5030 Other cervical disc degeneration, unspecified cervical region: Secondary | ICD-10-CM | POA: Diagnosis not present

## 2019-09-08 DIAGNOSIS — M47812 Spondylosis without myelopathy or radiculopathy, cervical region: Secondary | ICD-10-CM | POA: Diagnosis not present

## 2019-09-08 DIAGNOSIS — M5412 Radiculopathy, cervical region: Secondary | ICD-10-CM | POA: Diagnosis not present

## 2019-09-28 DIAGNOSIS — F1020 Alcohol dependence, uncomplicated: Secondary | ICD-10-CM | POA: Diagnosis not present

## 2019-09-28 DIAGNOSIS — F3181 Bipolar II disorder: Secondary | ICD-10-CM | POA: Diagnosis not present

## 2019-11-06 DIAGNOSIS — M5412 Radiculopathy, cervical region: Secondary | ICD-10-CM | POA: Diagnosis not present

## 2019-11-06 DIAGNOSIS — M5030 Other cervical disc degeneration, unspecified cervical region: Secondary | ICD-10-CM | POA: Diagnosis not present

## 2019-11-06 DIAGNOSIS — M47812 Spondylosis without myelopathy or radiculopathy, cervical region: Secondary | ICD-10-CM | POA: Diagnosis not present

## 2019-11-06 DIAGNOSIS — M79622 Pain in left upper arm: Secondary | ICD-10-CM | POA: Diagnosis not present

## 2020-03-21 DIAGNOSIS — F411 Generalized anxiety disorder: Secondary | ICD-10-CM | POA: Diagnosis not present

## 2020-03-21 DIAGNOSIS — F3181 Bipolar II disorder: Secondary | ICD-10-CM | POA: Diagnosis not present

## 2020-07-01 DIAGNOSIS — Z23 Encounter for immunization: Secondary | ICD-10-CM | POA: Diagnosis not present

## 2020-07-01 DIAGNOSIS — Z0000 Encounter for general adult medical examination without abnormal findings: Secondary | ICD-10-CM | POA: Diagnosis not present

## 2020-07-01 DIAGNOSIS — Z13220 Encounter for screening for lipoid disorders: Secondary | ICD-10-CM | POA: Diagnosis not present

## 2020-07-01 DIAGNOSIS — Z79899 Other long term (current) drug therapy: Secondary | ICD-10-CM | POA: Diagnosis not present

## 2020-07-26 DIAGNOSIS — F411 Generalized anxiety disorder: Secondary | ICD-10-CM | POA: Diagnosis not present

## 2020-07-26 DIAGNOSIS — F3181 Bipolar II disorder: Secondary | ICD-10-CM | POA: Diagnosis not present

## 2020-08-06 DIAGNOSIS — Z01818 Encounter for other preprocedural examination: Secondary | ICD-10-CM | POA: Diagnosis not present

## 2020-08-06 DIAGNOSIS — Z1211 Encounter for screening for malignant neoplasm of colon: Secondary | ICD-10-CM | POA: Diagnosis not present

## 2020-08-14 DIAGNOSIS — Z1159 Encounter for screening for other viral diseases: Secondary | ICD-10-CM | POA: Diagnosis not present

## 2020-08-16 DIAGNOSIS — K6289 Other specified diseases of anus and rectum: Secondary | ICD-10-CM | POA: Diagnosis not present

## 2020-08-16 DIAGNOSIS — K621 Rectal polyp: Secondary | ICD-10-CM | POA: Diagnosis not present

## 2020-08-16 DIAGNOSIS — Z1211 Encounter for screening for malignant neoplasm of colon: Secondary | ICD-10-CM | POA: Diagnosis not present

## 2020-08-16 DIAGNOSIS — K648 Other hemorrhoids: Secondary | ICD-10-CM | POA: Diagnosis not present

## 2020-12-12 DIAGNOSIS — F411 Generalized anxiety disorder: Secondary | ICD-10-CM | POA: Diagnosis not present

## 2020-12-12 DIAGNOSIS — F3181 Bipolar II disorder: Secondary | ICD-10-CM | POA: Diagnosis not present

## 2021-05-02 DIAGNOSIS — F3181 Bipolar II disorder: Secondary | ICD-10-CM | POA: Diagnosis not present

## 2021-05-02 DIAGNOSIS — F411 Generalized anxiety disorder: Secondary | ICD-10-CM | POA: Diagnosis not present

## 2021-07-06 DIAGNOSIS — Z23 Encounter for immunization: Secondary | ICD-10-CM | POA: Diagnosis not present

## 2021-07-29 DIAGNOSIS — Z0000 Encounter for general adult medical examination without abnormal findings: Secondary | ICD-10-CM | POA: Diagnosis not present

## 2021-07-29 DIAGNOSIS — Z23 Encounter for immunization: Secondary | ICD-10-CM | POA: Diagnosis not present

## 2021-07-29 DIAGNOSIS — Z125 Encounter for screening for malignant neoplasm of prostate: Secondary | ICD-10-CM | POA: Diagnosis not present

## 2021-07-29 DIAGNOSIS — Z13220 Encounter for screening for lipoid disorders: Secondary | ICD-10-CM | POA: Diagnosis not present

## 2021-07-29 DIAGNOSIS — Z79899 Other long term (current) drug therapy: Secondary | ICD-10-CM | POA: Diagnosis not present

## 2021-08-29 DIAGNOSIS — F411 Generalized anxiety disorder: Secondary | ICD-10-CM | POA: Diagnosis not present

## 2021-08-29 DIAGNOSIS — F3181 Bipolar II disorder: Secondary | ICD-10-CM | POA: Diagnosis not present

## 2023-09-16 ENCOUNTER — Other Ambulatory Visit: Payer: Self-pay | Admitting: Student

## 2024-05-02 ENCOUNTER — Telehealth: Payer: Self-pay

## 2024-05-02 NOTE — Telephone Encounter (Signed)
 error

## 2024-09-26 ENCOUNTER — Ambulatory Visit: Admitting: Neurology

## 2024-10-12 ENCOUNTER — Ambulatory Visit: Admitting: Neurology
